# Patient Record
Sex: Female | Born: 1945
Health system: Southern US, Community
[De-identification: ages and names within clinical notes are randomized; demographics above are authoritative.]

---

## 2006-02-23 ENCOUNTER — Ambulatory Visit: Payer: Self-pay

## 2006-11-26 ENCOUNTER — Emergency Department: Payer: Self-pay | Admitting: Emergency Medicine

## 2008-04-01 ENCOUNTER — Emergency Department: Payer: Self-pay | Admitting: Emergency Medicine

## 2008-04-11 ENCOUNTER — Ambulatory Visit: Payer: Self-pay | Admitting: Family Medicine

## 2008-06-06 ENCOUNTER — Emergency Department: Payer: Self-pay | Admitting: Emergency Medicine

## 2008-08-16 ENCOUNTER — Emergency Department: Payer: Self-pay | Admitting: Emergency Medicine

## 2009-04-01 ENCOUNTER — Emergency Department: Payer: Self-pay | Admitting: Emergency Medicine

## 2009-07-28 ENCOUNTER — Emergency Department: Payer: Self-pay | Admitting: Emergency Medicine

## 2009-10-11 ENCOUNTER — Ambulatory Visit: Payer: Self-pay | Admitting: Neurosurgery

## 2009-12-18 ENCOUNTER — Ambulatory Visit: Payer: Self-pay | Admitting: Family Medicine

## 2009-12-30 ENCOUNTER — Ambulatory Visit: Payer: Self-pay | Admitting: Emergency Medicine

## 2010-01-02 LAB — PATHOLOGY REPORT

## 2010-01-13 ENCOUNTER — Emergency Department: Payer: Self-pay | Admitting: Unknown Physician Specialty

## 2010-03-18 ENCOUNTER — Ambulatory Visit: Payer: Self-pay | Admitting: Gastroenterology

## 2010-03-20 LAB — PATHOLOGY REPORT

## 2010-09-21 ENCOUNTER — Emergency Department: Payer: Self-pay | Admitting: Emergency Medicine

## 2010-10-09 ENCOUNTER — Ambulatory Visit: Payer: Self-pay | Admitting: Family Medicine

## 2010-11-04 ENCOUNTER — Ambulatory Visit: Payer: Self-pay | Admitting: Oncology

## 2010-11-05 ENCOUNTER — Ambulatory Visit: Payer: Self-pay | Admitting: Oncology

## 2010-12-05 ENCOUNTER — Ambulatory Visit: Payer: Self-pay | Admitting: Oncology

## 2011-01-04 ENCOUNTER — Ambulatory Visit: Payer: Self-pay | Admitting: Oncology

## 2011-01-10 ENCOUNTER — Emergency Department: Payer: Self-pay | Admitting: Emergency Medicine

## 2011-02-01 ENCOUNTER — Observation Stay: Payer: Self-pay | Admitting: Internal Medicine

## 2011-04-13 ENCOUNTER — Ambulatory Visit: Payer: Self-pay | Admitting: Oncology

## 2011-04-13 LAB — CBC CANCER CENTER
Basophil #: 0 x10 3/mm (ref 0.0–0.1)
Basophil %: 0.6 %
Eosinophil #: 0.1 x10 3/mm (ref 0.0–0.7)
Eosinophil %: 2.7 %
HCT: 36 % (ref 35.0–47.0)
Lymphocyte #: 0.9 x10 3/mm — ABNORMAL LOW (ref 1.0–3.6)
MCH: 30.2 pg (ref 26.0–34.0)
MCV: 89 fL (ref 80–100)
Monocyte #: 0.3 x10 3/mm (ref 0.0–0.7)
Monocyte %: 6.6 %
Neutrophil #: 3.3 x10 3/mm (ref 1.4–6.5)
Platelet: 107 x10 3/mm — ABNORMAL LOW (ref 150–440)
RBC: 4.03 10*6/uL (ref 3.80–5.20)
RDW: 15.4 % — ABNORMAL HIGH (ref 11.5–14.5)
WBC: 4.6 x10 3/mm (ref 3.6–11.0)

## 2011-04-13 LAB — IRON AND TIBC
Iron Bind.Cap.(Total): 348 ug/dL (ref 250–450)
Unbound Iron-Bind.Cap.: 302 ug/dL

## 2011-04-13 LAB — FERRITIN: Ferritin (ARMC): 36 ng/mL (ref 8–388)

## 2011-04-29 ENCOUNTER — Ambulatory Visit: Payer: Self-pay | Admitting: Urology

## 2011-04-29 LAB — CREATININE, SERUM
Creatinine: 1.12 mg/dL (ref 0.60–1.30)
EGFR (African American): >60
EGFR (Non-African Amer.): 52 — ABNORMAL LOW

## 2011-05-03 ENCOUNTER — Inpatient Hospital Stay: Payer: Self-pay | Admitting: Internal Medicine

## 2011-05-03 LAB — BASIC METABOLIC PANEL
BUN: 19 mg/dL — ABNORMAL HIGH (ref 7–18)
Calcium, Total: 8.6 mg/dL (ref 8.5–10.1)
Chloride: 101 mmol/L (ref 98–107)
Creatinine: 1.44 mg/dL — ABNORMAL HIGH (ref 0.60–1.30)
EGFR (African American): 47 — ABNORMAL LOW
Potassium: 5 mmol/L (ref 3.5–5.1)

## 2011-05-03 LAB — CBC
MCHC: 33.6 g/dL (ref 32.0–36.0)
RDW: 15.7 % — ABNORMAL HIGH (ref 11.5–14.5)
WBC: 5.9 10*3/uL (ref 3.6–11.0)

## 2011-05-04 LAB — MAGNESIUM: Magnesium: 1.6 mg/dL — ABNORMAL LOW

## 2011-05-04 LAB — CBC WITH DIFFERENTIAL/PLATELET
Basophil #: 0 10*3/uL (ref 0.0–0.1)
Eosinophil %: 0.2 %
HGB: 10.2 g/dL — ABNORMAL LOW (ref 12.0–16.0)
Lymphocyte #: 0.5 10*3/uL — ABNORMAL LOW (ref 1.0–3.6)
Lymphocyte %: 13.6 %
MCH: 29.7 pg (ref 26.0–34.0)
MCHC: 33.2 g/dL (ref 32.0–36.0)
MCV: 90 fL (ref 80–100)
Monocyte %: 1.5 %
Neutrophil %: 84.3 %
Platelet: 77 10*3/uL — ABNORMAL LOW (ref 150–440)
RBC: 3.42 10*6/uL — ABNORMAL LOW (ref 3.80–5.20)
WBC: 3.7 10*3/uL (ref 3.6–11.0)

## 2011-05-04 LAB — BASIC METABOLIC PANEL
Anion Gap: 12 (ref 7–16)
Calcium, Total: 8.5 mg/dL (ref 8.5–10.1)
Co2: 19 mmol/L — ABNORMAL LOW (ref 21–32)
EGFR (African American): 40 — ABNORMAL LOW
EGFR (Non-African Amer.): 33 — ABNORMAL LOW
Glucose: 337 mg/dL — ABNORMAL HIGH (ref 65–99)
Osmolality: 278 (ref 275–301)

## 2011-05-04 LAB — HEMOGLOBIN A1C: Hemoglobin A1C: 8.8 % — ABNORMAL HIGH (ref 4.2–6.3)

## 2011-05-07 ENCOUNTER — Ambulatory Visit: Payer: Self-pay | Admitting: Oncology

## 2011-05-27 ENCOUNTER — Ambulatory Visit: Payer: Self-pay | Admitting: Urology

## 2011-06-03 ENCOUNTER — Inpatient Hospital Stay: Payer: Self-pay | Admitting: Internal Medicine

## 2011-06-03 LAB — URINALYSIS, COMPLETE
Bilirubin,UR: NEGATIVE
Glucose,UR: NEGATIVE mg/dL (ref 0–75)
Hyaline Cast: 1
Ketone: NEGATIVE
Specific Gravity: 1.018 (ref 1.003–1.030)
Squamous Epithelial: 1
WBC UR: 89 /HPF (ref 0–5)

## 2011-06-03 LAB — CBC
HCT: 33 % — ABNORMAL LOW (ref 35.0–47.0)
HGB: 10.9 g/dL — ABNORMAL LOW (ref 12.0–16.0)
MCHC: 33 g/dL (ref 32.0–36.0)
MCV: 90 fL (ref 80–100)
Platelet: 139 10*3/uL — ABNORMAL LOW (ref 150–440)
RDW: 16.7 % — ABNORMAL HIGH (ref 11.5–14.5)
WBC: 3.3 10*3/uL — ABNORMAL LOW (ref 3.6–11.0)

## 2011-06-03 LAB — COMPREHENSIVE METABOLIC PANEL
BUN: 23 mg/dL — ABNORMAL HIGH (ref 7–18)
Bilirubin,Total: 0.5 mg/dL (ref 0.2–1.0)
Calcium, Total: 9.4 mg/dL (ref 8.5–10.1)
Chloride: 106 mmol/L (ref 98–107)
Co2: 18 mmol/L — ABNORMAL LOW (ref 21–32)
Creatinine: 1.79 mg/dL — ABNORMAL HIGH (ref 0.60–1.30)
EGFR (African American): 36 — ABNORMAL LOW
Potassium: 7.1 mmol/L (ref 3.5–5.1)
SGOT(AST): 54 U/L — ABNORMAL HIGH (ref 15–37)
SGPT (ALT): 22 U/L

## 2011-06-03 LAB — POTASSIUM
Potassium: 7.2 mmol/L (ref 3.5–5.1)
Potassium: 6.7 mmol/L (ref 3.5–5.1)
Potassium: 5.8 mmol/L — ABNORMAL HIGH (ref 3.5–5.1)

## 2011-06-03 LAB — TROPONIN I
Troponin-I: <0.02 ng/mL
Troponin-I: <0.02 ng/mL

## 2011-06-03 LAB — CK TOTAL AND CKMB (NOT AT ARMC)
CK, Total: 85 U/L (ref 21–215)
CK-MB: 2.1 ng/mL (ref 0.5–3.6)

## 2011-06-04 LAB — URINALYSIS, COMPLETE
Bacteria: NONE SEEN
Bilirubin,UR: NEGATIVE
Glucose,UR: NEGATIVE mg/dL (ref 0–75)
Ketone: NEGATIVE
Leukocyte Esterase: NEGATIVE
Ph: 7 (ref 4.5–8.0)
RBC,UR: 1 /HPF (ref 0–5)
Specific Gravity: 1.005 (ref 1.003–1.030)
Squamous Epithelial: <1
WBC UR: 2 /HPF (ref 0–5)

## 2011-06-04 LAB — PROTEIN / CREATININE RATIO, URINE: Protein, Random Urine: 28 mg/dL — ABNORMAL HIGH (ref 0–12)

## 2011-06-04 LAB — CBC WITH DIFFERENTIAL/PLATELET
Basophil #: 0 10*3/uL (ref 0.0–0.1)
Basophil %: 1.8 %
Eosinophil #: 0.1 10*3/uL (ref 0.0–0.7)
Lymphocyte %: 28.2 %
MCHC: 33.4 g/dL (ref 32.0–36.0)
MCV: 89 fL (ref 80–100)
Monocyte #: 0.3 10*3/uL (ref 0.0–0.7)
Neutrophil %: 55.9 %
Platelet: 122 10*3/uL — ABNORMAL LOW (ref 150–440)
RDW: 16.7 % — ABNORMAL HIGH (ref 11.5–14.5)
WBC: 2.6 10*3/uL — ABNORMAL LOW (ref 3.6–11.0)

## 2011-06-04 LAB — COMPREHENSIVE METABOLIC PANEL
Albumin: 2.7 g/dL — ABNORMAL LOW (ref 3.4–5.0)
Anion Gap: 7 (ref 7–16)
Bilirubin,Total: 0.7 mg/dL (ref 0.2–1.0)
Calcium, Total: 9.6 mg/dL (ref 8.5–10.1)
Chloride: 109 mmol/L — ABNORMAL HIGH (ref 98–107)
Creatinine: 1.71 mg/dL — ABNORMAL HIGH (ref 0.60–1.30)
EGFR (African American): 38 — ABNORMAL LOW
Glucose: 47 mg/dL — ABNORMAL LOW (ref 65–99)
Osmolality: 282 (ref 275–301)
Potassium: 5 mmol/L (ref 3.5–5.1)
SGOT(AST): 43 U/L — ABNORMAL HIGH (ref 15–37)
Sodium: 141 mmol/L (ref 136–145)
Total Protein: 7.5 g/dL (ref 6.4–8.2)

## 2011-06-04 LAB — CK TOTAL AND CKMB (NOT AT ARMC)
CK, Total: 62 U/L (ref 21–215)
CK-MB: 1.5 ng/mL (ref 0.5–3.6)

## 2011-06-04 LAB — TROPONIN I: Troponin-I: <0.02 ng/mL

## 2011-06-05 LAB — CBC WITH DIFFERENTIAL/PLATELET
Basophil #: 0.1 10*3/uL (ref 0.0–0.1)
Eosinophil %: 3.8 %
HCT: 32.5 % — ABNORMAL LOW (ref 35.0–47.0)
HGB: 10.8 g/dL — ABNORMAL LOW (ref 12.0–16.0)
Lymphocyte %: 16 %
MCH: 29.4 pg (ref 26.0–34.0)
Monocyte %: 9.1 %
Neutrophil %: 69.6 %
Platelet: 118 10*3/uL — ABNORMAL LOW (ref 150–440)
RBC: 3.67 10*6/uL — ABNORMAL LOW (ref 3.80–5.20)

## 2011-06-05 LAB — BASIC METABOLIC PANEL
Anion Gap: 14 (ref 7–16)
Calcium, Total: 9 mg/dL (ref 8.5–10.1)
Chloride: 103 mmol/L (ref 98–107)
EGFR (Non-African Amer.): 39 — ABNORMAL LOW
Glucose: 172 mg/dL — ABNORMAL HIGH (ref 65–99)
Osmolality: 283 (ref 275–301)
Potassium: 5.3 mmol/L — ABNORMAL HIGH (ref 3.5–5.1)

## 2011-06-06 LAB — CBC WITH DIFFERENTIAL/PLATELET
Basophil #: 0.1 10*3/uL (ref 0.0–0.1)
Eosinophil %: 3.2 %
HCT: 29.5 % — ABNORMAL LOW (ref 35.0–47.0)
Lymphocyte #: 0.8 10*3/uL — ABNORMAL LOW (ref 1.0–3.6)
Lymphocyte %: 23.8 %
MCHC: 32.8 g/dL (ref 32.0–36.0)
MCV: 90 fL (ref 80–100)
Monocyte #: 0.4 10*3/uL (ref 0.0–0.7)
Monocyte %: 12.8 %
Neutrophil #: 2 10*3/uL (ref 1.4–6.5)
Neutrophil %: 58.6 %
RBC: 3.29 10*6/uL — ABNORMAL LOW (ref 3.80–5.20)
RDW: 16.5 % — ABNORMAL HIGH (ref 11.5–14.5)
WBC: 3.5 10*3/uL — ABNORMAL LOW (ref 3.6–11.0)

## 2011-06-06 LAB — RENAL FUNCTION PANEL
Albumin: 2.5 g/dL — ABNORMAL LOW (ref 3.4–5.0)
Anion Gap: 14 (ref 7–16)
BUN: 13 mg/dL (ref 7–18)
EGFR (African American): >60
EGFR (Non-African Amer.): 52 — ABNORMAL LOW
Glucose: 154 mg/dL — ABNORMAL HIGH (ref 65–99)
Osmolality: 286 (ref 275–301)
Phosphorus: 3 mg/dL (ref 2.5–4.9)
Potassium: 3.8 mmol/L (ref 3.5–5.1)

## 2011-06-06 LAB — TROPONIN I
Troponin-I: <0.02 ng/mL
Troponin-I: <0.02 ng/mL

## 2011-06-06 LAB — CK TOTAL AND CKMB (NOT AT ARMC)
CK, Total: 31 U/L (ref 21–215)
CK-MB: 0.5 ng/mL (ref 0.5–3.6)

## 2011-06-06 LAB — MAGNESIUM: Magnesium: 1.5 mg/dL — ABNORMAL LOW

## 2011-06-07 LAB — CBC WITH DIFFERENTIAL/PLATELET
Basophil %: 1.1 %
Eosinophil #: 0.1 10*3/uL (ref 0.0–0.7)
HCT: 28.3 % — ABNORMAL LOW (ref 35.0–47.0)
MCH: 29.3 pg (ref 26.0–34.0)
MCHC: 32.5 g/dL (ref 32.0–36.0)
MCV: 90 fL (ref 80–100)
Monocyte #: 0.4 10*3/uL (ref 0.0–0.7)
Neutrophil #: 2.2 10*3/uL (ref 1.4–6.5)
Neutrophil %: 64 %
RBC: 3.13 10*6/uL — ABNORMAL LOW (ref 3.80–5.20)
RDW: 16.9 % — ABNORMAL HIGH (ref 11.5–14.5)
WBC: 3.4 10*3/uL — ABNORMAL LOW (ref 3.6–11.0)

## 2011-06-07 LAB — BASIC METABOLIC PANEL
BUN: 12 mg/dL (ref 7–18)
Chloride: 106 mmol/L (ref 98–107)
Co2: 22 mmol/L (ref 21–32)
Glucose: 153 mg/dL — ABNORMAL HIGH (ref 65–99)
Osmolality: 284 (ref 275–301)
Potassium: 4.3 mmol/L (ref 3.5–5.1)

## 2011-06-07 LAB — MAGNESIUM: Magnesium: 2.5 mg/dL — ABNORMAL HIGH

## 2011-06-08 LAB — PROTEIN ELECTROPHORESIS(ARMC)

## 2011-06-29 ENCOUNTER — Ambulatory Visit: Payer: Self-pay | Admitting: Urology

## 2011-07-04 LAB — COMPREHENSIVE METABOLIC PANEL
Alkaline Phosphatase: 119 U/L (ref 50–136)
BUN: 16 mg/dL (ref 7–18)
Bilirubin,Total: 1 mg/dL (ref 0.2–1.0)
Chloride: 103 mmol/L (ref 98–107)
Co2: 23 mmol/L (ref 21–32)
EGFR (Non-African Amer.): >60
Osmolality: 280 (ref 275–301)
Potassium: 3.2 mmol/L — ABNORMAL LOW (ref 3.5–5.1)
SGOT(AST): 96 U/L — ABNORMAL HIGH (ref 15–37)
SGPT (ALT): 34 U/L

## 2011-07-04 LAB — URINALYSIS, COMPLETE
Bacteria: NONE SEEN
Bilirubin,UR: NEGATIVE
Glucose,UR: 50 mg/dL (ref 0–75)
Hyaline Cast: 3
Ketone: NEGATIVE
Nitrite: NEGATIVE
Protein: 100
RBC,UR: 1 /HPF (ref 0–5)
WBC UR: 6 /HPF (ref 0–5)

## 2011-07-04 LAB — CBC
HGB: 10.8 g/dL — ABNORMAL LOW (ref 12.0–16.0)
MCH: 29.5 pg (ref 26.0–34.0)
MCV: 88 fL (ref 80–100)
Platelet: 82 10*3/uL — ABNORMAL LOW (ref 150–440)
RDW: 16.2 % — ABNORMAL HIGH (ref 11.5–14.5)
WBC: 3.5 10*3/uL — ABNORMAL LOW (ref 3.6–11.0)

## 2011-07-04 LAB — TROPONIN I: Troponin-I: <0.02 ng/mL

## 2011-07-05 ENCOUNTER — Inpatient Hospital Stay: Payer: Self-pay | Admitting: *Deleted

## 2011-07-05 LAB — APTT
Activated PTT: 35.4 secs (ref 23.6–35.9)
Activated PTT: 93.5 secs — ABNORMAL HIGH (ref 23.6–35.9)

## 2011-07-05 LAB — AMMONIA: Ammonia, Plasma: 57 mcmol/L — ABNORMAL HIGH (ref 11–32)

## 2011-07-05 LAB — TROPONIN I
Troponin-I: <0.02 ng/mL
Troponin-I: <0.02 ng/mL

## 2011-07-05 LAB — PROTIME-INR: INR: 1.4

## 2011-07-05 LAB — CK TOTAL AND CKMB (NOT AT ARMC)
CK, Total: 28 U/L (ref 21–215)
CK-MB: 0.5 ng/mL (ref 0.5–3.6)
CK-MB: 0.6 ng/mL (ref 0.5–3.6)

## 2011-07-06 LAB — BASIC METABOLIC PANEL
Chloride: 110 mmol/L — ABNORMAL HIGH (ref 98–107)
EGFR (African American): 52 — ABNORMAL LOW
Potassium: 3.7 mmol/L (ref 3.5–5.1)
Sodium: 141 mmol/L (ref 136–145)

## 2011-07-06 LAB — CBC WITH DIFFERENTIAL/PLATELET
Basophil #: 0 10*3/uL (ref 0.0–0.1)
Eosinophil #: 0.1 10*3/uL (ref 0.0–0.7)
Eosinophil %: 3.9 %
HCT: 30.2 % — ABNORMAL LOW (ref 35.0–47.0)
HGB: 9.9 g/dL — ABNORMAL LOW (ref 12.0–16.0)
MCHC: 32.8 g/dL (ref 32.0–36.0)
MCV: 89 fL (ref 80–100)
Monocyte #: 0.4 10*3/uL (ref 0.0–0.7)
Neutrophil %: 53.2 %
RBC: 3.37 10*6/uL — ABNORMAL LOW (ref 3.80–5.20)
RDW: 16.6 % — ABNORMAL HIGH (ref 11.5–14.5)
WBC: 3.5 10*3/uL — ABNORMAL LOW (ref 3.6–11.0)

## 2011-07-06 LAB — URINE CULTURE

## 2011-07-06 LAB — MAGNESIUM: Magnesium: 1.1 mg/dL — ABNORMAL LOW

## 2011-07-06 LAB — WBCS, STOOL

## 2011-07-06 LAB — APTT: Activated PTT: 127.4 secs — ABNORMAL HIGH (ref 23.6–35.9)

## 2011-07-07 LAB — BASIC METABOLIC PANEL
Anion Gap: 12 (ref 7–16)
BUN: 21 mg/dL — ABNORMAL HIGH (ref 7–18)
Chloride: 107 mmol/L (ref 98–107)
Creatinine: 1.24 mg/dL (ref 0.60–1.30)
EGFR (African American): 56 — ABNORMAL LOW
EGFR (Non-African Amer.): 46 — ABNORMAL LOW
Osmolality: 283 (ref 275–301)
Potassium: 4.3 mmol/L (ref 3.5–5.1)
Sodium: 138 mmol/L (ref 136–145)

## 2011-07-07 LAB — AMMONIA: Ammonia, Plasma: 65 mcmol/L — ABNORMAL HIGH (ref 11–32)

## 2011-07-07 LAB — STOOL CULTURE

## 2011-07-17 LAB — COMPREHENSIVE METABOLIC PANEL
Anion Gap: 16 (ref 7–16)
BUN: 27 mg/dL — ABNORMAL HIGH (ref 7–18)
Bilirubin,Total: 0.5 mg/dL (ref 0.2–1.0)
Calcium, Total: 8.5 mg/dL (ref 8.5–10.1)
Chloride: 99 mmol/L (ref 98–107)
EGFR (Non-African Amer.): 34 — ABNORMAL LOW
Osmolality: 285 (ref 275–301)
Potassium: 4 mmol/L (ref 3.5–5.1)
Sodium: 132 mmol/L — ABNORMAL LOW (ref 136–145)
Total Protein: 9.3 g/dL — ABNORMAL HIGH (ref 6.4–8.2)

## 2011-07-17 LAB — CBC
HCT: 34.3 % — ABNORMAL LOW (ref 35.0–47.0)
HGB: 11.3 g/dL — ABNORMAL LOW (ref 12.0–16.0)
MCH: 29.5 pg (ref 26.0–34.0)
MCHC: 33 g/dL (ref 32.0–36.0)
MCV: 89 fL (ref 80–100)
Platelet: 83 10*3/uL — ABNORMAL LOW (ref 150–440)
RBC: 3.84 10*6/uL (ref 3.80–5.20)
RDW: 15.4 % — ABNORMAL HIGH (ref 11.5–14.5)
WBC: 3.4 10*3/uL — ABNORMAL LOW (ref 3.6–11.0)

## 2011-07-17 LAB — TROPONIN I: Troponin-I: <0.02 ng/mL

## 2011-07-17 LAB — PRO B NATRIURETIC PEPTIDE: B-Type Natriuretic Peptide: 425 pg/mL — ABNORMAL HIGH (ref 0–125)

## 2011-07-18 LAB — BASIC METABOLIC PANEL
BUN: 26 mg/dL — ABNORMAL HIGH (ref 7–18)
Calcium, Total: 8.9 mg/dL (ref 8.5–10.1)
Chloride: 98 mmol/L (ref 98–107)
Co2: 23 mmol/L (ref 21–32)
Creatinine: 1.35 mg/dL — ABNORMAL HIGH (ref 0.60–1.30)
EGFR (African American): 47 — ABNORMAL LOW
EGFR (Non-African Amer.): 41 — ABNORMAL LOW
Glucose: 287 mg/dL — ABNORMAL HIGH (ref 65–99)
Osmolality: 285 (ref 275–301)
Sodium: 135 mmol/L — ABNORMAL LOW (ref 136–145)

## 2011-07-19 ENCOUNTER — Inpatient Hospital Stay: Payer: Self-pay | Admitting: Specialist

## 2011-07-19 LAB — BASIC METABOLIC PANEL
Chloride: 96 mmol/L — ABNORMAL LOW (ref 98–107)
Co2: 22 mmol/L (ref 21–32)
Creatinine: 1.25 mg/dL (ref 0.60–1.30)
EGFR (African American): 52 — ABNORMAL LOW
EGFR (Non-African Amer.): 44 — ABNORMAL LOW
Glucose: 395 mg/dL — ABNORMAL HIGH (ref 65–99)
Sodium: 131 mmol/L — ABNORMAL LOW (ref 136–145)

## 2011-07-20 LAB — BASIC METABOLIC PANEL
Anion Gap: 11 (ref 7–16)
BUN: 37 mg/dL — ABNORMAL HIGH (ref 7–18)
Creatinine: 1.18 mg/dL (ref 0.60–1.30)
Glucose: 390 mg/dL — ABNORMAL HIGH (ref 65–99)
Potassium: 4.2 mmol/L (ref 3.5–5.1)

## 2011-07-21 LAB — PLATELET COUNT: Platelet: 79 10*3/uL — ABNORMAL LOW (ref 150–440)

## 2011-07-23 LAB — CULTURE, BLOOD (SINGLE)

## 2011-09-02 ENCOUNTER — Ambulatory Visit: Payer: Self-pay | Admitting: Oncology

## 2011-09-02 LAB — CBC CANCER CENTER
Basophil #: 0.1 x10 3/mm (ref 0.0–0.1)
Basophil %: 2.8 %
Eosinophil #: 0.1 x10 3/mm (ref 0.0–0.7)
Lymphocyte %: 23.7 %
MCHC: 32.1 g/dL (ref 32.0–36.0)
MCV: 87 fL (ref 80–100)
Monocyte #: 0.5 x10 3/mm (ref 0.2–0.9)
Monocyte %: 9.6 %
Neutrophil #: 2.9 x10 3/mm (ref 1.4–6.5)
Platelet: 103 x10 3/mm — ABNORMAL LOW (ref 150–440)
RBC: 4.01 10*6/uL (ref 3.80–5.20)
RDW: 16.2 % — ABNORMAL HIGH (ref 11.5–14.5)
WBC: 4.7 x10 3/mm (ref 3.6–11.0)

## 2011-09-02 LAB — FERRITIN: Ferritin (ARMC): 22 ng/mL (ref 8–388)

## 2011-09-02 LAB — IRON AND TIBC
Iron Saturation: 23 %
Iron: 83 ug/dL (ref 50–170)

## 2011-09-04 ENCOUNTER — Ambulatory Visit: Payer: Self-pay | Admitting: Oncology

## 2011-09-18 ENCOUNTER — Inpatient Hospital Stay: Payer: Self-pay | Admitting: Internal Medicine

## 2011-09-18 LAB — DIGOXIN LEVEL: Digoxin: <0.06 ng/mL

## 2011-09-18 LAB — URINALYSIS, COMPLETE
Bacteria: NONE SEEN
Glucose,UR: NEGATIVE mg/dL (ref 0–75)
Ketone: NEGATIVE
Leukocyte Esterase: NEGATIVE
Ph: 6 (ref 4.5–8.0)
Specific Gravity: 1.009 (ref 1.003–1.030)
Squamous Epithelial: 1

## 2011-09-18 LAB — COMPREHENSIVE METABOLIC PANEL
Albumin: 2.6 g/dL — ABNORMAL LOW (ref 3.4–5.0)
BUN: 39 mg/dL — ABNORMAL HIGH (ref 7–18)
Bilirubin,Total: 0.7 mg/dL (ref 0.2–1.0)
Chloride: 107 mmol/L (ref 98–107)
Creatinine: 1.75 mg/dL — ABNORMAL HIGH (ref 0.60–1.30)
EGFR (Non-African Amer.): 30 — ABNORMAL LOW
Glucose: 176 mg/dL — ABNORMAL HIGH (ref 65–99)
Potassium: 6.4 mmol/L — ABNORMAL HIGH (ref 3.5–5.1)
SGOT(AST): 41 U/L — ABNORMAL HIGH (ref 15–37)

## 2011-09-18 LAB — CBC WITH DIFFERENTIAL/PLATELET
Basophil #: 0.1 10*3/uL (ref 0.0–0.1)
Basophil %: 1.3 %
Eosinophil #: 0.2 10*3/uL (ref 0.0–0.7)
Eosinophil %: 4.4 %
HCT: 31.6 % — ABNORMAL LOW (ref 35.0–47.0)
Lymphocyte #: 0.9 10*3/uL — ABNORMAL LOW (ref 1.0–3.6)
Lymphocyte %: 23.1 %
MCH: 28.2 pg (ref 26.0–34.0)
MCHC: 32.4 g/dL (ref 32.0–36.0)
MCV: 87 fL (ref 80–100)
Monocyte #: 0.5 x10 3/mm (ref 0.2–0.9)
Monocyte %: 12.1 %
Neutrophil %: 59.1 %
Platelet: 102 10*3/uL — ABNORMAL LOW (ref 150–440)
RBC: 3.63 10*6/uL — ABNORMAL LOW (ref 3.80–5.20)
WBC: 4.1 10*3/uL (ref 3.6–11.0)

## 2011-09-18 LAB — TSH: Thyroid Stimulating Horm: 1.09 u[IU]/mL

## 2011-09-18 LAB — AMMONIA: Ammonia, Plasma: 84 mcmol/L — ABNORMAL HIGH (ref 11–32)

## 2011-09-19 LAB — BASIC METABOLIC PANEL
Anion Gap: 11 (ref 7–16)
BUN: 26 mg/dL — ABNORMAL HIGH (ref 7–18)
Calcium, Total: 8.8 mg/dL (ref 8.5–10.1)
Chloride: 103 mmol/L (ref 98–107)
Co2: 22 mmol/L (ref 21–32)
Creatinine: 1.26 mg/dL (ref 0.60–1.30)
EGFR (African American): 51 — ABNORMAL LOW
EGFR (Non-African Amer.): 44 — ABNORMAL LOW
Glucose: 248 mg/dL — ABNORMAL HIGH (ref 65–99)
Osmolality: 285 (ref 275–301)
Potassium: 4.6 mmol/L (ref 3.5–5.1)

## 2011-09-19 LAB — PROTIME-INR: INR: 1.3

## 2011-09-19 LAB — MAGNESIUM: Magnesium: 1.3 mg/dL — ABNORMAL LOW

## 2011-10-05 ENCOUNTER — Emergency Department: Payer: Self-pay | Admitting: Emergency Medicine

## 2011-10-05 LAB — COMPREHENSIVE METABOLIC PANEL
Albumin: 3 g/dL — ABNORMAL LOW (ref 3.4–5.0)
Alkaline Phosphatase: 180 U/L — ABNORMAL HIGH (ref 50–136)
Bilirubin,Total: 0.7 mg/dL (ref 0.2–1.0)
Calcium, Total: 9.5 mg/dL (ref 8.5–10.1)
Chloride: 103 mmol/L (ref 98–107)
Co2: 23 mmol/L (ref 21–32)
Creatinine: 1.33 mg/dL — ABNORMAL HIGH (ref 0.60–1.30)
EGFR (African American): 48 — ABNORMAL LOW
EGFR (Non-African Amer.): 41 — ABNORMAL LOW
Glucose: 438 mg/dL — ABNORMAL HIGH (ref 65–99)
SGOT(AST): 35 U/L (ref 15–37)
SGPT (ALT): 18 U/L
Sodium: 134 mmol/L — ABNORMAL LOW (ref 136–145)
Total Protein: 9.4 g/dL — ABNORMAL HIGH (ref 6.4–8.2)

## 2011-10-05 LAB — CBC WITH DIFFERENTIAL/PLATELET
Basophil #: 0 10*3/uL (ref 0.0–0.1)
Eosinophil #: 0.1 10*3/uL (ref 0.0–0.7)
Lymphocyte %: 21.8 %
MCH: 28.5 pg (ref 26.0–34.0)
MCHC: 32.2 g/dL (ref 32.0–36.0)
Monocyte #: 0.3 x10 3/mm (ref 0.2–0.9)
Neutrophil #: 2.1 10*3/uL (ref 1.4–6.5)
Neutrophil %: 63.5 %
RDW: 17 % — ABNORMAL HIGH (ref 11.5–14.5)
WBC: 3.3 10*3/uL — ABNORMAL LOW (ref 3.6–11.0)

## 2011-10-05 LAB — URINALYSIS, COMPLETE
Bacteria: NONE SEEN
Glucose,UR: >=500 mg/dL (ref 0–75)
Ketone: NEGATIVE
Nitrite: NEGATIVE
RBC,UR: <1 /HPF (ref 0–5)
Specific Gravity: 1.018 (ref 1.003–1.030)
Squamous Epithelial: <1
WBC UR: 12 /HPF (ref 0–5)

## 2011-10-13 ENCOUNTER — Inpatient Hospital Stay: Payer: Self-pay | Admitting: Internal Medicine

## 2011-10-13 LAB — COMPREHENSIVE METABOLIC PANEL
Albumin: 2.7 g/dL — ABNORMAL LOW (ref 3.4–5.0)
Anion Gap: 9 (ref 7–16)
BUN: 23 mg/dL — ABNORMAL HIGH (ref 7–18)
Bilirubin,Total: 0.7 mg/dL (ref 0.2–1.0)
Chloride: 103 mmol/L (ref 98–107)
Creatinine: 1.43 mg/dL — ABNORMAL HIGH (ref 0.60–1.30)
EGFR (African American): 44 — ABNORMAL LOW
EGFR (Non-African Amer.): 38 — ABNORMAL LOW
Glucose: 367 mg/dL — ABNORMAL HIGH (ref 65–99)
Osmolality: 285 (ref 275–301)
Potassium: 5.7 mmol/L — ABNORMAL HIGH (ref 3.5–5.1)
SGOT(AST): 45 U/L — ABNORMAL HIGH (ref 15–37)
Sodium: 133 mmol/L — ABNORMAL LOW (ref 136–145)
Total Protein: 8.6 g/dL — ABNORMAL HIGH (ref 6.4–8.2)

## 2011-10-13 LAB — URINALYSIS, COMPLETE
Hyaline Cast: 13
Nitrite: NEGATIVE
Ph: 5 (ref 4.5–8.0)
Protein: 30
RBC,UR: NONE SEEN /HPF (ref 0–5)
Squamous Epithelial: 2
WBC UR: 4 /HPF (ref 0–5)

## 2011-10-13 LAB — CBC
MCHC: 32.2 g/dL (ref 32.0–36.0)
MCV: 88 fL (ref 80–100)
Platelet: 97 10*3/uL — ABNORMAL LOW (ref 150–440)
RBC: 3.51 10*6/uL — ABNORMAL LOW (ref 3.80–5.20)
RDW: 16.8 % — ABNORMAL HIGH (ref 11.5–14.5)
WBC: 4.6 10*3/uL (ref 3.6–11.0)

## 2011-10-13 LAB — PROTIME-INR
INR: 1.2
Prothrombin Time: 15.9 secs — ABNORMAL HIGH (ref 11.5–14.7)

## 2011-10-13 LAB — AMMONIA: Ammonia, Plasma: 184 mcmol/L — ABNORMAL HIGH (ref 11–32)

## 2011-10-13 LAB — APTT: Activated PTT: 36 secs — ABNORMAL HIGH (ref 23.6–35.9)

## 2011-10-13 LAB — CK TOTAL AND CKMB (NOT AT ARMC): CK, Total: 50 U/L (ref 21–215)

## 2011-10-13 LAB — TROPONIN I: Troponin-I: <0.02 ng/mL

## 2011-10-14 LAB — COMPREHENSIVE METABOLIC PANEL
Albumin: 2.7 g/dL — ABNORMAL LOW (ref 3.4–5.0)
Alkaline Phosphatase: 145 U/L — ABNORMAL HIGH (ref 50–136)
Anion Gap: 7 (ref 7–16)
BUN: 23 mg/dL — ABNORMAL HIGH (ref 7–18)
Bilirubin,Total: 0.6 mg/dL (ref 0.2–1.0)
Creatinine: 1.38 mg/dL — ABNORMAL HIGH (ref 0.60–1.30)
Glucose: 242 mg/dL — ABNORMAL HIGH (ref 65–99)
Osmolality: 285 (ref 275–301)
Potassium: 4.7 mmol/L (ref 3.5–5.1)
SGOT(AST): 37 U/L (ref 15–37)
Sodium: 137 mmol/L (ref 136–145)
Total Protein: 8.5 g/dL — ABNORMAL HIGH (ref 6.4–8.2)

## 2011-10-14 LAB — CBC WITH DIFFERENTIAL/PLATELET
Eosinophil #: 0.2 10*3/uL (ref 0.0–0.7)
Eosinophil %: 4.3 %
HGB: 10 g/dL — ABNORMAL LOW (ref 12.0–16.0)
MCH: 28.2 pg (ref 26.0–34.0)
MCHC: 31.7 g/dL — ABNORMAL LOW (ref 32.0–36.0)
Monocyte #: 0.3 x10 3/mm (ref 0.2–0.9)
Neutrophil #: 2.2 10*3/uL (ref 1.4–6.5)
Neutrophil %: 60.3 %
Platelet: 83 10*3/uL — ABNORMAL LOW (ref 150–440)
RBC: 3.56 10*6/uL — ABNORMAL LOW (ref 3.80–5.20)
RDW: 16.6 % — ABNORMAL HIGH (ref 11.5–14.5)

## 2011-10-15 ENCOUNTER — Emergency Department: Payer: Self-pay | Admitting: Emergency Medicine

## 2011-10-15 LAB — BASIC METABOLIC PANEL
BUN: 29 mg/dL — ABNORMAL HIGH (ref 7–18)
Calcium, Total: 9 mg/dL (ref 8.5–10.1)
Chloride: 104 mmol/L (ref 98–107)
Creatinine: 1.56 mg/dL — ABNORMAL HIGH (ref 0.60–1.30)
EGFR (Non-African Amer.): 34 — ABNORMAL LOW
Glucose: 266 mg/dL — ABNORMAL HIGH (ref 65–99)
Potassium: 4.9 mmol/L (ref 3.5–5.1)

## 2011-10-15 LAB — CBC
HCT: 29.1 % — ABNORMAL LOW (ref 35.0–47.0)
HGB: 9.4 g/dL — ABNORMAL LOW (ref 12.0–16.0)
MCHC: 32.2 g/dL (ref 32.0–36.0)
MCV: 88 fL (ref 80–100)
RDW: 16.5 % — ABNORMAL HIGH (ref 11.5–14.5)
WBC: 4 10*3/uL (ref 3.6–11.0)

## 2011-10-16 ENCOUNTER — Inpatient Hospital Stay: Payer: Self-pay | Admitting: Internal Medicine

## 2011-10-16 LAB — URINALYSIS, COMPLETE
Blood: NEGATIVE
Glucose,UR: NEGATIVE mg/dL (ref 0–75)
Ketone: NEGATIVE
Ph: 5 (ref 4.5–8.0)
RBC,UR: 6 /HPF (ref 0–5)
Specific Gravity: 1.017 (ref 1.003–1.030)
Squamous Epithelial: <1
WBC UR: 27 /HPF (ref 0–5)

## 2011-10-16 LAB — COMPREHENSIVE METABOLIC PANEL
Albumin: 2.7 g/dL — ABNORMAL LOW (ref 3.4–5.0)
Alkaline Phosphatase: 139 U/L — ABNORMAL HIGH (ref 50–136)
Anion Gap: 8 (ref 7–16)
BUN: 29 mg/dL — ABNORMAL HIGH (ref 7–18)
Calcium, Total: 9.1 mg/dL (ref 8.5–10.1)
Chloride: 106 mmol/L (ref 98–107)
Co2: 23 mmol/L (ref 21–32)
EGFR (African American): 32 — ABNORMAL LOW
EGFR (Non-African Amer.): 28 — ABNORMAL LOW
Osmolality: 284 (ref 275–301)
Potassium: 4.9 mmol/L (ref 3.5–5.1)
SGOT(AST): 54 U/L — ABNORMAL HIGH (ref 15–37)
Sodium: 137 mmol/L (ref 136–145)

## 2011-10-16 LAB — CBC
HCT: 30.2 % — ABNORMAL LOW (ref 35.0–47.0)
MCH: 27.9 pg (ref 26.0–34.0)
MCV: 88 fL (ref 80–100)
Platelet: 91 10*3/uL — ABNORMAL LOW (ref 150–440)
RBC: 3.42 10*6/uL — ABNORMAL LOW (ref 3.80–5.20)
RDW: 16.6 % — ABNORMAL HIGH (ref 11.5–14.5)

## 2011-10-16 LAB — CK TOTAL AND CKMB (NOT AT ARMC)
CK, Total: 24 U/L (ref 21–215)
CK-MB: <0.5 ng/mL — ABNORMAL LOW (ref 0.5–3.6)

## 2011-10-16 LAB — AMMONIA: Ammonia, Plasma: 82 mcmol/L — ABNORMAL HIGH (ref 11–32)

## 2011-10-16 LAB — CLOSTRIDIUM DIFFICILE BY PCR

## 2011-10-17 LAB — COMPREHENSIVE METABOLIC PANEL
Anion Gap: 10 (ref 7–16)
BUN: 22 mg/dL — ABNORMAL HIGH (ref 7–18)
Bilirubin,Total: 0.4 mg/dL (ref 0.2–1.0)
Chloride: 108 mmol/L — ABNORMAL HIGH (ref 98–107)
Co2: 20 mmol/L — ABNORMAL LOW (ref 21–32)
Creatinine: 1.22 mg/dL (ref 0.60–1.30)
EGFR (African American): 53 — ABNORMAL LOW
EGFR (Non-African Amer.): 46 — ABNORMAL LOW
Osmolality: 289 (ref 275–301)
Potassium: 4.6 mmol/L (ref 3.5–5.1)
SGPT (ALT): 28 U/L
Sodium: 138 mmol/L (ref 136–145)
Total Protein: 8.2 g/dL (ref 6.4–8.2)

## 2011-10-17 LAB — CBC WITH DIFFERENTIAL/PLATELET
HCT: 29.9 % — ABNORMAL LOW (ref 35.0–47.0)
Lymphocyte %: 21.8 %
MCHC: 30.7 g/dL — ABNORMAL LOW (ref 32.0–36.0)
Monocyte #: 0.3 x10 3/mm (ref 0.2–0.9)
Neutrophil #: 1.8 10*3/uL (ref 1.4–6.5)
Neutrophil %: 63.2 %
RDW: 16.7 % — ABNORMAL HIGH (ref 11.5–14.5)

## 2011-10-18 LAB — URINE CULTURE

## 2011-11-29 ENCOUNTER — Ambulatory Visit: Payer: Self-pay | Admitting: Ophthalmology

## 2011-11-29 LAB — POTASSIUM: Potassium: 4.6 mmol/L (ref 3.5–5.1)

## 2011-12-07 ENCOUNTER — Other Ambulatory Visit: Payer: Self-pay | Admitting: Anesthesiology

## 2011-12-14 ENCOUNTER — Ambulatory Visit: Payer: Self-pay | Admitting: Internal Medicine

## 2012-01-03 ENCOUNTER — Ambulatory Visit: Payer: Self-pay | Admitting: Ophthalmology

## 2012-01-03 LAB — POTASSIUM: Potassium: 4.3 mmol/L (ref 3.5–5.1)

## 2012-01-18 ENCOUNTER — Ambulatory Visit: Payer: Self-pay | Admitting: Ophthalmology

## 2012-01-30 ENCOUNTER — Inpatient Hospital Stay: Payer: Self-pay | Admitting: Internal Medicine

## 2012-01-30 LAB — PROTIME-INR
INR: 1.1
Prothrombin Time: 14.9 secs — ABNORMAL HIGH (ref 11.5–14.7)

## 2012-01-30 LAB — COMPREHENSIVE METABOLIC PANEL
Albumin: 3 g/dL — ABNORMAL LOW (ref 3.4–5.0)
Alkaline Phosphatase: 310 U/L — ABNORMAL HIGH (ref 50–136)
Anion Gap: 9 (ref 7–16)
BUN: 27 mg/dL — ABNORMAL HIGH (ref 7–18)
Bilirubin,Total: 0.7 mg/dL (ref 0.2–1.0)
Calcium, Total: 9.7 mg/dL (ref 8.5–10.1)
Co2: 21 mmol/L (ref 21–32)
EGFR (Non-African Amer.): 24 — ABNORMAL LOW
Glucose: 273 mg/dL — ABNORMAL HIGH (ref 65–99)
Osmolality: 279 (ref 275–301)
Potassium: 6.9 mmol/L (ref 3.5–5.1)
Sodium: 132 mmol/L — ABNORMAL LOW (ref 136–145)

## 2012-01-30 LAB — URINALYSIS, COMPLETE
Bilirubin,UR: NEGATIVE
Blood: NEGATIVE
Glucose,UR: >=500 mg/dL (ref 0–75)
Hyaline Cast: 28
Ketone: NEGATIVE
Nitrite: NEGATIVE
RBC,UR: 2 /HPF (ref 0–5)
Specific Gravity: 1.013 (ref 1.003–1.030)
Squamous Epithelial: 3
WBC UR: 14 /HPF (ref 0–5)

## 2012-01-30 LAB — CBC
MCHC: 32 g/dL (ref 32.0–36.0)
MCV: 86 fL (ref 80–100)
Platelet: 90 10*3/uL — ABNORMAL LOW (ref 150–440)
RDW: 16.8 % — ABNORMAL HIGH (ref 11.5–14.5)
WBC: 9.4 10*3/uL (ref 3.6–11.0)

## 2012-01-31 LAB — CBC WITH DIFFERENTIAL/PLATELET
Eosinophil #: 0.3 10*3/uL (ref 0.0–0.7)
HCT: 35.8 % (ref 35.0–47.0)
MCH: 28.4 pg (ref 26.0–34.0)
MCHC: 33.2 g/dL (ref 32.0–36.0)
MCV: 86 fL (ref 80–100)
Monocyte #: 0.7 x10 3/mm (ref 0.2–0.9)
Neutrophil #: 6.9 10*3/uL — ABNORMAL HIGH (ref 1.4–6.5)
Platelet: 121 10*3/uL — ABNORMAL LOW (ref 150–440)
RDW: 16.9 % — ABNORMAL HIGH (ref 11.5–14.5)

## 2012-01-31 LAB — COMPREHENSIVE METABOLIC PANEL
Anion Gap: 8 (ref 7–16)
BUN: 27 mg/dL — ABNORMAL HIGH (ref 7–18)
Calcium, Total: 9.4 mg/dL (ref 8.5–10.1)
Chloride: 106 mmol/L (ref 98–107)
Co2: 24 mmol/L (ref 21–32)
EGFR (African American): 26 — ABNORMAL LOW
EGFR (Non-African Amer.): 23 — ABNORMAL LOW
SGOT(AST): 71 U/L — ABNORMAL HIGH (ref 15–37)
SGPT (ALT): 39 U/L (ref 12–78)
Total Protein: 9.3 g/dL — ABNORMAL HIGH (ref 6.4–8.2)

## 2012-02-01 LAB — BASIC METABOLIC PANEL
Anion Gap: 9 (ref 7–16)
Calcium, Total: 8.2 mg/dL — ABNORMAL LOW (ref 8.5–10.1)
Co2: 21 mmol/L (ref 21–32)
Creatinine: 1.42 mg/dL — ABNORMAL HIGH (ref 0.60–1.30)
EGFR (African American): 44 — ABNORMAL LOW
EGFR (Non-African Amer.): 38 — ABNORMAL LOW
Glucose: 229 mg/dL — ABNORMAL HIGH (ref 65–99)
Osmolality: 287 (ref 275–301)
Sodium: 139 mmol/L (ref 136–145)

## 2012-02-02 LAB — URINE CULTURE

## 2012-03-14 ENCOUNTER — Ambulatory Visit: Payer: Self-pay | Admitting: Ophthalmology

## 2012-03-14 ENCOUNTER — Emergency Department: Payer: Self-pay | Admitting: Emergency Medicine

## 2012-03-14 LAB — POTASSIUM: Potassium: 4 mmol/L (ref 3.5–5.1)

## 2012-03-14 LAB — URINALYSIS, COMPLETE
Bacteria: NONE SEEN
Hyaline Cast: 4
Ketone: NEGATIVE
Ph: 5 (ref 4.5–8.0)
Protein: 30
RBC,UR: 1 /HPF (ref 0–5)
Squamous Epithelial: 1

## 2012-03-14 LAB — CBC
HCT: 34 % — ABNORMAL LOW (ref 35.0–47.0)
MCHC: 31.6 g/dL — ABNORMAL LOW (ref 32.0–36.0)
MCV: 85 fL (ref 80–100)
RBC: 3.98 10*6/uL (ref 3.80–5.20)
RDW: 15.4 % — ABNORMAL HIGH (ref 11.5–14.5)
WBC: 4.4 10*3/uL (ref 3.6–11.0)

## 2012-03-14 LAB — COMPREHENSIVE METABOLIC PANEL
Albumin: 2.8 g/dL — ABNORMAL LOW (ref 3.4–5.0)
Alkaline Phosphatase: 249 U/L — ABNORMAL HIGH (ref 50–136)
Calcium, Total: 8.8 mg/dL (ref 8.5–10.1)
Chloride: 103 mmol/L (ref 98–107)
Co2: 25 mmol/L (ref 21–32)
EGFR (African American): 58 — ABNORMAL LOW
EGFR (Non-African Amer.): 50 — ABNORMAL LOW
Glucose: 213 mg/dL — ABNORMAL HIGH (ref 65–99)
Osmolality: 277 (ref 275–301)
SGOT(AST): 38 U/L — ABNORMAL HIGH (ref 15–37)
SGPT (ALT): 20 U/L (ref 12–78)
Sodium: 135 mmol/L — ABNORMAL LOW (ref 136–145)

## 2012-03-14 LAB — AMMONIA: Ammonia, Plasma: 49 mcmol/L — ABNORMAL HIGH (ref 11–32)

## 2012-03-24 ENCOUNTER — Ambulatory Visit: Payer: Self-pay | Admitting: Ophthalmology

## 2012-05-06 ENCOUNTER — Ambulatory Visit: Payer: Self-pay | Admitting: Oncology

## 2012-08-06 ENCOUNTER — Inpatient Hospital Stay: Payer: Self-pay | Admitting: Internal Medicine

## 2012-08-06 LAB — CBC
MCH: 28.8 pg (ref 26.0–34.0)
MCHC: 33.1 g/dL (ref 32.0–36.0)
MCV: 87 fL (ref 80–100)
Platelet: 75 10*3/uL — ABNORMAL LOW (ref 150–440)
RBC: 3.53 10*6/uL — ABNORMAL LOW (ref 3.80–5.20)
RDW: 16.9 % — ABNORMAL HIGH (ref 11.5–14.5)
WBC: 4.8 10*3/uL (ref 3.6–11.0)

## 2012-08-06 LAB — AMMONIA: Ammonia, Plasma: 65 mcmol/L — ABNORMAL HIGH (ref 11–32)

## 2012-08-06 LAB — URINALYSIS, COMPLETE
Bilirubin,UR: NEGATIVE
Blood: NEGATIVE
Glucose,UR: NEGATIVE mg/dL (ref 0–75)
Hyaline Cast: 14
Ketone: NEGATIVE
Ph: 5 (ref 4.5–8.0)
Protein: 30
RBC,UR: 2 /HPF (ref 0–5)
Specific Gravity: 1.016 (ref 1.003–1.030)

## 2012-08-06 LAB — COMPREHENSIVE METABOLIC PANEL
Albumin: 2.5 g/dL — ABNORMAL LOW (ref 3.4–5.0)
Alkaline Phosphatase: 218 U/L — ABNORMAL HIGH (ref 50–136)
Anion Gap: 7 (ref 7–16)
Calcium, Total: 8.5 mg/dL (ref 8.5–10.1)
Chloride: 110 mmol/L — ABNORMAL HIGH (ref 98–107)
Creatinine: 1.78 mg/dL — ABNORMAL HIGH (ref 0.60–1.30)
EGFR (Non-African Amer.): 29 — ABNORMAL LOW
Glucose: 165 mg/dL — ABNORMAL HIGH (ref 65–99)
Osmolality: 289 (ref 275–301)
Total Protein: 8.4 g/dL — ABNORMAL HIGH (ref 6.4–8.2)

## 2012-08-06 LAB — CK TOTAL AND CKMB (NOT AT ARMC)
CK, Total: 66 U/L (ref 21–215)
CK-MB: 1.2 ng/mL (ref 0.5–3.6)

## 2012-08-06 LAB — PROTIME-INR: INR: 1.3

## 2012-08-06 LAB — TROPONIN I: Troponin-I: <0.02 ng/mL

## 2012-08-07 DIAGNOSIS — R9431 Abnormal electrocardiogram [ECG] [EKG]: Secondary | ICD-10-CM

## 2012-08-07 LAB — CBC WITH DIFFERENTIAL/PLATELET
Basophil #: 0.1 10*3/uL (ref 0.0–0.1)
Eosinophil %: 3.4 %
HCT: 30.8 % — ABNORMAL LOW (ref 35.0–47.0)
Lymphocyte #: 0.9 10*3/uL — ABNORMAL LOW (ref 1.0–3.6)
MCH: 28.9 pg (ref 26.0–34.0)
MCV: 88 fL (ref 80–100)
Monocyte #: 0.4 x10 3/mm (ref 0.2–0.9)

## 2012-08-07 LAB — COMPREHENSIVE METABOLIC PANEL
Anion Gap: 8 (ref 7–16)
BUN: 27 mg/dL — ABNORMAL HIGH (ref 7–18)
Calcium, Total: 8.6 mg/dL (ref 8.5–10.1)
Chloride: 111 mmol/L — ABNORMAL HIGH (ref 98–107)
EGFR (African American): 39 — ABNORMAL LOW
Glucose: 171 mg/dL — ABNORMAL HIGH (ref 65–99)
Osmolality: 289 (ref 275–301)
Potassium: 4.4 mmol/L (ref 3.5–5.1)
SGOT(AST): 55 U/L — ABNORMAL HIGH (ref 15–37)
SGPT (ALT): 24 U/L (ref 12–78)
Sodium: 140 mmol/L (ref 136–145)
Total Protein: 7.8 g/dL (ref 6.4–8.2)

## 2012-08-07 LAB — TROPONIN I: Troponin-I: <0.02 ng/mL

## 2012-08-08 LAB — COMPREHENSIVE METABOLIC PANEL
Albumin: 2.2 g/dL — ABNORMAL LOW (ref 3.4–5.0)
Alkaline Phosphatase: 224 U/L — ABNORMAL HIGH (ref 50–136)
Bilirubin,Total: 0.4 mg/dL (ref 0.2–1.0)
Chloride: 112 mmol/L — ABNORMAL HIGH (ref 98–107)
Creatinine: 1.19 mg/dL (ref 0.60–1.30)
EGFR (African American): 54 — ABNORMAL LOW
EGFR (Non-African Amer.): 47 — ABNORMAL LOW
Glucose: 161 mg/dL — ABNORMAL HIGH (ref 65–99)
Potassium: 4.6 mmol/L (ref 3.5–5.1)
SGOT(AST): 66 U/L — ABNORMAL HIGH (ref 15–37)

## 2012-08-08 LAB — CBC WITH DIFFERENTIAL/PLATELET
Basophil #: 0 10*3/uL (ref 0.0–0.1)
Basophil %: 1.3 %
HCT: 27.2 % — ABNORMAL LOW (ref 35.0–47.0)
HGB: 9 g/dL — ABNORMAL LOW (ref 12.0–16.0)
Lymphocyte #: 0.7 10*3/uL — ABNORMAL LOW (ref 1.0–3.6)
MCH: 28.7 pg (ref 26.0–34.0)
MCHC: 33.2 g/dL (ref 32.0–36.0)
Monocyte #: 0.3 x10 3/mm (ref 0.2–0.9)
Monocyte %: 8.2 %
Neutrophil #: 2.3 10*3/uL (ref 1.4–6.5)
RBC: 3.15 10*6/uL — ABNORMAL LOW (ref 3.80–5.20)
RDW: 16.7 % — ABNORMAL HIGH (ref 11.5–14.5)
WBC: 3.4 10*3/uL — ABNORMAL LOW (ref 3.6–11.0)

## 2012-08-08 LAB — AMMONIA: Ammonia, Plasma: 75 mcmol/L — ABNORMAL HIGH (ref 11–32)

## 2012-08-08 LAB — URINE CULTURE

## 2012-08-14 ENCOUNTER — Inpatient Hospital Stay: Payer: Self-pay | Admitting: Specialist

## 2012-08-14 LAB — URINALYSIS, COMPLETE
Blood: NEGATIVE
Hyaline Cast: 21
Ph: 5 (ref 4.5–8.0)
Protein: NEGATIVE
RBC,UR: 4 /HPF (ref 0–5)
Specific Gravity: 1.018 (ref 1.003–1.030)

## 2012-08-14 LAB — COMPREHENSIVE METABOLIC PANEL
Albumin: 2.4 g/dL — ABNORMAL LOW (ref 3.4–5.0)
Anion Gap: 7 (ref 7–16)
BUN: 33 mg/dL — ABNORMAL HIGH (ref 7–18)
Bilirubin,Total: 0.5 mg/dL (ref 0.2–1.0)
Calcium, Total: 8 mg/dL — ABNORMAL LOW (ref 8.5–10.1)
Chloride: 111 mmol/L — ABNORMAL HIGH (ref 98–107)
Creatinine: 2.59 mg/dL — ABNORMAL HIGH (ref 0.60–1.30)
EGFR (African American): 21 — ABNORMAL LOW
Glucose: 110 mg/dL — ABNORMAL HIGH (ref 65–99)
Osmolality: 282 (ref 275–301)
Potassium: 5.2 mmol/L — ABNORMAL HIGH (ref 3.5–5.1)
SGPT (ALT): 23 U/L (ref 12–78)
Sodium: 137 mmol/L (ref 136–145)
Total Protein: 7.9 g/dL (ref 6.4–8.2)

## 2012-08-14 LAB — CBC
HCT: 28.2 % — ABNORMAL LOW (ref 35.0–47.0)
HGB: 9.2 g/dL — ABNORMAL LOW (ref 12.0–16.0)
MCHC: 32.6 g/dL (ref 32.0–36.0)
MCV: 88 fL (ref 80–100)
Platelet: 84 10*3/uL — ABNORMAL LOW (ref 150–440)
RBC: 3.19 10*6/uL — ABNORMAL LOW (ref 3.80–5.20)
WBC: 5.9 10*3/uL (ref 3.6–11.0)

## 2012-08-15 LAB — BASIC METABOLIC PANEL
Anion Gap: 5 — ABNORMAL LOW (ref 7–16)
Chloride: 112 mmol/L — ABNORMAL HIGH (ref 98–107)
Creatinine: 2.63 mg/dL — ABNORMAL HIGH (ref 0.60–1.30)
EGFR (African American): 21 — ABNORMAL LOW
EGFR (Non-African Amer.): 18 — ABNORMAL LOW
Glucose: 47 mg/dL — ABNORMAL LOW (ref 65–99)
Osmolality: 281 (ref 275–301)

## 2012-08-16 LAB — CBC WITH DIFFERENTIAL/PLATELET
Basophil %: 2 %
HGB: 9.5 g/dL — ABNORMAL LOW (ref 12.0–16.0)
Lymphocyte #: 0.8 10*3/uL — ABNORMAL LOW (ref 1.0–3.6)
MCH: 29.1 pg (ref 26.0–34.0)
MCV: 89 fL (ref 80–100)
Monocyte #: 0.5 x10 3/mm (ref 0.2–0.9)
Monocyte %: 11.9 %
Neutrophil %: 62.7 %
Platelet: 73 10*3/uL — ABNORMAL LOW (ref 150–440)
RBC: 3.26 10*6/uL — ABNORMAL LOW (ref 3.80–5.20)
RDW: 16.8 % — ABNORMAL HIGH (ref 11.5–14.5)

## 2012-08-16 LAB — BASIC METABOLIC PANEL
Anion Gap: 8 (ref 7–16)
Calcium, Total: 8 mg/dL — ABNORMAL LOW (ref 8.5–10.1)
Chloride: 110 mmol/L — ABNORMAL HIGH (ref 98–107)
Co2: 19 mmol/L — ABNORMAL LOW (ref 21–32)
EGFR (African American): 31 — ABNORMAL LOW
EGFR (Non-African Amer.): 26 — ABNORMAL LOW
Glucose: 95 mg/dL (ref 65–99)
Osmolality: 281 (ref 275–301)
Potassium: 4.9 mmol/L (ref 3.5–5.1)

## 2012-08-17 LAB — POTASSIUM: Potassium: 4.9 mmol/L (ref 3.5–5.1)

## 2012-08-17 LAB — BASIC METABOLIC PANEL
Anion Gap: 6 — ABNORMAL LOW (ref 7–16)
BUN: 30 mg/dL — ABNORMAL HIGH (ref 7–18)
Creatinine: 1.74 mg/dL — ABNORMAL HIGH (ref 0.60–1.30)
Glucose: 100 mg/dL — ABNORMAL HIGH (ref 65–99)
Potassium: 6 mmol/L — ABNORMAL HIGH (ref 3.5–5.1)
Sodium: 140 mmol/L (ref 136–145)

## 2012-08-20 ENCOUNTER — Inpatient Hospital Stay: Payer: Self-pay | Admitting: Specialist

## 2012-08-20 LAB — URINALYSIS, COMPLETE
Bilirubin,UR: NEGATIVE
Glucose,UR: NEGATIVE mg/dL (ref 0–75)
Ketone: NEGATIVE
Leukocyte Esterase: NEGATIVE
Nitrite: NEGATIVE
Protein: NEGATIVE
RBC,UR: 1 /HPF (ref 0–5)
Specific Gravity: 1.006 (ref 1.003–1.030)
WBC UR: 2 /HPF (ref 0–5)

## 2012-08-20 LAB — CBC
HGB: 9.8 g/dL — ABNORMAL LOW (ref 12.0–16.0)
MCH: 28.5 pg (ref 26.0–34.0)
Platelet: 99 10*3/uL — ABNORMAL LOW (ref 150–440)
RBC: 3.43 10*6/uL — ABNORMAL LOW (ref 3.80–5.20)
WBC: 5.4 10*3/uL (ref 3.6–11.0)

## 2012-08-20 LAB — CK TOTAL AND CKMB (NOT AT ARMC)
CK-MB: 2.8 ng/mL (ref 0.5–3.6)
CK-MB: 2.1 ng/mL (ref 0.5–3.6)

## 2012-08-20 LAB — COMPREHENSIVE METABOLIC PANEL
Albumin: 2.6 g/dL — ABNORMAL LOW (ref 3.4–5.0)
Alkaline Phosphatase: 251 U/L — ABNORMAL HIGH (ref 50–136)
BUN: 29 mg/dL — ABNORMAL HIGH (ref 7–18)
Bilirubin,Total: 0.4 mg/dL (ref 0.2–1.0)
Chloride: 108 mmol/L — ABNORMAL HIGH (ref 98–107)
Co2: 19 mmol/L — ABNORMAL LOW (ref 21–32)
EGFR (African American): 36 — ABNORMAL LOW
Glucose: 32 mg/dL — CL (ref 65–99)
Potassium: 5 mmol/L (ref 3.5–5.1)

## 2012-08-20 LAB — PRO B NATRIURETIC PEPTIDE: B-Type Natriuretic Peptide: 1048 pg/mL — ABNORMAL HIGH (ref 0–125)

## 2012-08-21 LAB — BASIC METABOLIC PANEL
Anion Gap: 8 (ref 7–16)
Anion Gap: 9 (ref 7–16)
BUN: 32 mg/dL — ABNORMAL HIGH (ref 7–18)
BUN: 35 mg/dL — ABNORMAL HIGH (ref 7–18)
Calcium, Total: 8 mg/dL — ABNORMAL LOW (ref 8.5–10.1)
Calcium, Total: 8.7 mg/dL (ref 8.5–10.1)
Chloride: 107 mmol/L (ref 98–107)
Co2: 19 mmol/L — ABNORMAL LOW (ref 21–32)
Co2: 20 mmol/L — ABNORMAL LOW (ref 21–32)
EGFR (African American): 32 — ABNORMAL LOW
EGFR (African American): 33 — ABNORMAL LOW
Glucose: 106 mg/dL — ABNORMAL HIGH (ref 65–99)
Osmolality: 280 (ref 275–301)
Potassium: 5.5 mmol/L — ABNORMAL HIGH (ref 3.5–5.1)

## 2012-08-21 LAB — CBC WITH DIFFERENTIAL/PLATELET
Basophil #: 0 10*3/uL (ref 0.0–0.1)
Basophil %: 0.3 %
Eosinophil %: 0.3 %
Eosinophil %: 0.5 %
HCT: 27 % — ABNORMAL LOW (ref 35.0–47.0)
HGB: 8.8 g/dL — ABNORMAL LOW (ref 12.0–16.0)
HGB: 8.9 g/dL — ABNORMAL LOW (ref 12.0–16.0)
Lymphocyte %: 12 %
MCH: 28.7 pg (ref 26.0–34.0)
MCH: 28.7 pg (ref 26.0–34.0)
MCHC: 31.9 g/dL — ABNORMAL LOW (ref 32.0–36.0)
MCHC: 32.9 g/dL (ref 32.0–36.0)
MCV: 87 fL (ref 80–100)
Monocyte %: 7.3 %
Platelet: 69 10*3/uL — ABNORMAL LOW (ref 150–440)
Platelet: 81 10*3/uL — ABNORMAL LOW (ref 150–440)
RBC: 3.07 10*6/uL — ABNORMAL LOW (ref 3.80–5.20)
RBC: 3.1 10*6/uL — ABNORMAL LOW (ref 3.80–5.20)
RDW: 16.5 % — ABNORMAL HIGH (ref 11.5–14.5)
WBC: 1.8 10*3/uL — CL (ref 3.6–11.0)

## 2012-08-21 LAB — IRON AND TIBC
Iron Bind.Cap.(Total): 343 ug/dL (ref 250–450)
Unbound Iron-Bind.Cap.: 317 ug/dL

## 2012-08-21 LAB — FOLATE: Folic Acid: 17.4 ng/mL (ref 3.1–100.0)

## 2012-08-21 LAB — HEPATIC FUNCTION PANEL A (ARMC)
Albumin: 2.3 g/dL — ABNORMAL LOW (ref 3.4–5.0)
Alkaline Phosphatase: 212 U/L — ABNORMAL HIGH (ref 50–136)
Bilirubin, Direct: 0.2 mg/dL (ref 0.00–0.20)
Total Protein: 7.7 g/dL (ref 6.4–8.2)

## 2012-08-21 LAB — CK TOTAL AND CKMB (NOT AT ARMC): CK, Total: 95 U/L (ref 21–215)

## 2012-08-21 LAB — FERRITIN: Ferritin (ARMC): 33 ng/mL (ref 8–388)

## 2012-08-21 LAB — RETICULOCYTES: Absolute Retic Count: 0.0503 10*6/uL

## 2012-08-22 LAB — BASIC METABOLIC PANEL
BUN: 36 mg/dL — ABNORMAL HIGH (ref 7–18)
Chloride: 110 mmol/L — ABNORMAL HIGH (ref 98–107)
Co2: 22 mmol/L (ref 21–32)
Creatinine: 1.53 mg/dL — ABNORMAL HIGH (ref 0.60–1.30)
EGFR (Non-African Amer.): 35 — ABNORMAL LOW
Glucose: 102 mg/dL — ABNORMAL HIGH (ref 65–99)

## 2012-08-22 LAB — OCCULT BLOOD X 1 CARD TO LAB, STOOL: Occult Blood, Feces: NEGATIVE

## 2012-08-23 LAB — CBC WITH DIFFERENTIAL/PLATELET
Basophil #: 0 10*3/uL (ref 0.0–0.1)
Basophil %: 1.5 %
HCT: 26 % — ABNORMAL LOW (ref 35.0–47.0)
Lymphocyte %: 26.4 %
MCH: 29 pg (ref 26.0–34.0)
MCHC: 33.3 g/dL (ref 32.0–36.0)
MCV: 87 fL (ref 80–100)
Monocyte #: 0.3 x10 3/mm (ref 0.2–0.9)
Monocyte %: 10.8 %
Neutrophil %: 57.5 %
Platelet: 75 10*3/uL — ABNORMAL LOW (ref 150–440)
RBC: 2.98 10*6/uL — ABNORMAL LOW (ref 3.80–5.20)
RDW: 16.7 % — ABNORMAL HIGH (ref 11.5–14.5)
WBC: 2.9 10*3/uL — ABNORMAL LOW (ref 3.6–11.0)

## 2012-08-23 LAB — COMPREHENSIVE METABOLIC PANEL
Albumin: 2.2 g/dL — ABNORMAL LOW (ref 3.4–5.0)
Alkaline Phosphatase: 249 U/L — ABNORMAL HIGH (ref 50–136)
Anion Gap: 4 — ABNORMAL LOW (ref 7–16)
Calcium, Total: 8.9 mg/dL (ref 8.5–10.1)
Chloride: 109 mmol/L — ABNORMAL HIGH (ref 98–107)
Creatinine: 1.48 mg/dL — ABNORMAL HIGH (ref 0.60–1.30)
EGFR (African American): 42 — ABNORMAL LOW
EGFR (Non-African Amer.): 36 — ABNORMAL LOW
Glucose: 74 mg/dL (ref 65–99)
Potassium: 4.5 mmol/L (ref 3.5–5.1)
SGOT(AST): 84 U/L — ABNORMAL HIGH (ref 15–37)
Sodium: 140 mmol/L (ref 136–145)
Total Protein: 7.3 g/dL (ref 6.4–8.2)

## 2012-08-24 LAB — CBC WITH DIFFERENTIAL/PLATELET
Basophil #: 0 10*3/uL (ref 0.0–0.1)
Eosinophil #: 0.1 10*3/uL (ref 0.0–0.7)
Eosinophil %: 5.2 %
HCT: 29.1 % — ABNORMAL LOW (ref 35.0–47.0)
HGB: 9.8 g/dL — ABNORMAL LOW (ref 12.0–16.0)
Lymphocyte %: 19 %
MCH: 29.4 pg (ref 26.0–34.0)
MCHC: 33.8 g/dL (ref 32.0–36.0)
MCV: 87 fL (ref 80–100)
Monocyte #: 0.3 x10 3/mm (ref 0.2–0.9)
Monocyte %: 9.8 %
Neutrophil #: 1.9 10*3/uL (ref 1.4–6.5)
Platelet: 73 10*3/uL — ABNORMAL LOW (ref 150–440)
RBC: 3.35 10*6/uL — ABNORMAL LOW (ref 3.80–5.20)
RDW: 16.8 % — ABNORMAL HIGH (ref 11.5–14.5)
WBC: 2.9 10*3/uL — ABNORMAL LOW (ref 3.6–11.0)

## 2012-08-24 LAB — BASIC METABOLIC PANEL
BUN: 28 mg/dL — ABNORMAL HIGH (ref 7–18)
Chloride: 107 mmol/L (ref 98–107)
Co2: 27 mmol/L (ref 21–32)
Creatinine: 1.52 mg/dL — ABNORMAL HIGH (ref 0.60–1.30)
EGFR (African American): 40 — ABNORMAL LOW
Glucose: 209 mg/dL — ABNORMAL HIGH (ref 65–99)
Sodium: 141 mmol/L (ref 136–145)

## 2012-08-25 LAB — RENAL FUNCTION PANEL
Anion Gap: 6 — ABNORMAL LOW (ref 7–16)
Calcium, Total: 8.4 mg/dL — ABNORMAL LOW (ref 8.5–10.1)
Creatinine: 1.5 mg/dL — ABNORMAL HIGH (ref 0.60–1.30)
EGFR (Non-African Amer.): 35 — ABNORMAL LOW
Glucose: 121 mg/dL — ABNORMAL HIGH (ref 65–99)
Osmolality: 281 (ref 275–301)
Phosphorus: 3.4 mg/dL (ref 2.5–4.9)
Potassium: 4.1 mmol/L (ref 3.5–5.1)
Sodium: 138 mmol/L (ref 136–145)

## 2012-08-29 ENCOUNTER — Inpatient Hospital Stay: Payer: Self-pay | Admitting: Specialist

## 2012-08-29 LAB — URINALYSIS, COMPLETE
Bilirubin,UR: NEGATIVE
Blood: NEGATIVE
Glucose,UR: NEGATIVE mg/dL (ref 0–75)
Hyaline Cast: 1
Ketone: NEGATIVE
Protein: NEGATIVE
RBC,UR: NONE SEEN /HPF (ref 0–5)
Squamous Epithelial: 1
WBC UR: 2 /HPF (ref 0–5)

## 2012-08-29 LAB — COMPREHENSIVE METABOLIC PANEL
Alkaline Phosphatase: 246 U/L — ABNORMAL HIGH (ref 50–136)
Anion Gap: 9 (ref 7–16)
BUN: 38 mg/dL — ABNORMAL HIGH (ref 7–18)
Chloride: 100 mmol/L (ref 98–107)
Creatinine: 2.81 mg/dL — ABNORMAL HIGH (ref 0.60–1.30)
Glucose: 72 mg/dL (ref 65–99)
Osmolality: 274 (ref 275–301)
SGOT(AST): 64 U/L — ABNORMAL HIGH (ref 15–37)
Sodium: 133 mmol/L — ABNORMAL LOW (ref 136–145)
Total Protein: 7.6 g/dL (ref 6.4–8.2)

## 2012-08-29 LAB — CBC
HGB: 9.6 g/dL — ABNORMAL LOW (ref 12.0–16.0)
MCH: 28.9 pg (ref 26.0–34.0)
MCHC: 33.3 g/dL (ref 32.0–36.0)
MCV: 87 fL (ref 80–100)
Platelet: 59 10*3/uL — ABNORMAL LOW (ref 150–440)
RBC: 3.32 10*6/uL — ABNORMAL LOW (ref 3.80–5.20)

## 2012-08-29 LAB — CK TOTAL AND CKMB (NOT AT ARMC)
CK, Total: 91 U/L (ref 21–215)
CK-MB: 1.5 ng/mL (ref 0.5–3.6)

## 2012-08-29 LAB — LIPASE, BLOOD: Lipase: 83 U/L (ref 73–393)

## 2012-08-29 LAB — AMMONIA: Ammonia, Plasma: 46 mcmol/L — ABNORMAL HIGH (ref 11–32)

## 2012-08-29 LAB — TROPONIN I: Troponin-I: <0.02 ng/mL

## 2012-08-29 LAB — PROTIME-INR
INR: 1.3
Prothrombin Time: 16.2 secs — ABNORMAL HIGH (ref 11.5–14.7)

## 2012-08-30 LAB — BASIC METABOLIC PANEL
Anion Gap: 7 (ref 7–16)
BUN: 32 mg/dL — ABNORMAL HIGH (ref 7–18)
Creatinine: 2.43 mg/dL — ABNORMAL HIGH (ref 0.60–1.30)
EGFR (African American): 23 — ABNORMAL LOW
Osmolality: 275 (ref 275–301)
Sodium: 134 mmol/L — ABNORMAL LOW (ref 136–145)

## 2012-08-30 LAB — CBC WITH DIFFERENTIAL/PLATELET
Eosinophil #: 0.1 10*3/uL (ref 0.0–0.7)
Eosinophil %: 2.1 %
HCT: 29.3 % — ABNORMAL LOW (ref 35.0–47.0)
HGB: 9.9 g/dL — ABNORMAL LOW (ref 12.0–16.0)
Lymphocyte %: 22.7 %
MCH: 29.6 pg (ref 26.0–34.0)
MCHC: 34 g/dL (ref 32.0–36.0)
MCV: 87 fL (ref 80–100)
Monocyte #: 0.6 x10 3/mm (ref 0.2–0.9)
Monocyte %: 15.4 %
Neutrophil #: 2.4 10*3/uL (ref 1.4–6.5)
Neutrophil %: 58.5 %
RBC: 3.35 10*6/uL — ABNORMAL LOW (ref 3.80–5.20)

## 2012-08-30 LAB — CREATININE, URINE, RANDOM: Creatinine, Urine Random: 30.8 mg/dL (ref 30.0–125.0)

## 2012-08-30 LAB — SODIUM, URINE, RANDOM: Sodium, Urine Random: 24 mmol/L (ref 20–110)

## 2012-08-31 LAB — BASIC METABOLIC PANEL
BUN: 28 mg/dL — ABNORMAL HIGH (ref 7–18)
Calcium, Total: 8.4 mg/dL — ABNORMAL LOW (ref 8.5–10.1)
Co2: 25 mmol/L (ref 21–32)
Creatinine: 2.1 mg/dL — ABNORMAL HIGH (ref 0.60–1.30)
EGFR (African American): 27 — ABNORMAL LOW
EGFR (Non-African Amer.): 24 — ABNORMAL LOW
Osmolality: 276 (ref 275–301)
Potassium: 4.1 mmol/L (ref 3.5–5.1)
Sodium: 136 mmol/L (ref 136–145)

## 2012-09-01 LAB — BASIC METABOLIC PANEL
BUN: 24 mg/dL — ABNORMAL HIGH (ref 7–18)
Calcium, Total: 8.8 mg/dL (ref 8.5–10.1)
Chloride: 105 mmol/L (ref 98–107)
EGFR (Non-African Amer.): 28 — ABNORMAL LOW
Glucose: 97 mg/dL (ref 65–99)
Osmolality: 278 (ref 275–301)
Sodium: 137 mmol/L (ref 136–145)

## 2012-09-10 ENCOUNTER — Inpatient Hospital Stay: Payer: Self-pay | Admitting: Internal Medicine

## 2012-09-10 LAB — URINALYSIS, COMPLETE
Bacteria: NONE SEEN
Bilirubin,UR: NEGATIVE
Bilirubin,UR: NEGATIVE
Blood: NEGATIVE
Glucose,UR: NEGATIVE mg/dL (ref 0–75)
Glucose,UR: NEGATIVE mg/dL (ref 0–75)
Ketone: NEGATIVE
Leukocyte Esterase: NEGATIVE
Nitrite: NEGATIVE
Ph: 6 (ref 4.5–8.0)
Ph: 6 (ref 4.5–8.0)
Protein: NEGATIVE
Protein: NEGATIVE
RBC,UR: <1 /HPF (ref 0–5)
Specific Gravity: 1.006 (ref 1.003–1.030)
Squamous Epithelial: NONE SEEN
WBC UR: 1 /HPF (ref 0–5)
WBC UR: 3 /HPF (ref 0–5)

## 2012-09-10 LAB — COMPREHENSIVE METABOLIC PANEL
Albumin: 3.5 g/dL (ref 3.4–5.0)
Alkaline Phosphatase: 221 U/L — ABNORMAL HIGH (ref 50–136)
Bilirubin,Total: 0.9 mg/dL (ref 0.2–1.0)
Chloride: 111 mmol/L — ABNORMAL HIGH (ref 98–107)
Co2: 19 mmol/L — ABNORMAL LOW (ref 21–32)
Creatinine: 1.88 mg/dL — ABNORMAL HIGH (ref 0.60–1.30)
EGFR (African American): 31 — ABNORMAL LOW
Osmolality: 285 (ref 275–301)
SGOT(AST): 69 U/L — ABNORMAL HIGH (ref 15–37)
SGPT (ALT): 29 U/L (ref 12–78)
Total Protein: 8.3 g/dL — ABNORMAL HIGH (ref 6.4–8.2)

## 2012-09-10 LAB — AMMONIA: Ammonia, Plasma: 138 mcmol/L — ABNORMAL HIGH (ref 11–32)

## 2012-09-10 LAB — CBC WITH DIFFERENTIAL/PLATELET
Basophil #: 0 10*3/uL (ref 0.0–0.1)
Basophil %: 1.6 %
Eosinophil #: 0.1 10*3/uL (ref 0.0–0.7)
HGB: 9 g/dL — ABNORMAL LOW (ref 12.0–16.0)
Lymphocyte #: 0.6 10*3/uL — ABNORMAL LOW (ref 1.0–3.6)
MCHC: 32.5 g/dL (ref 32.0–36.0)
Monocyte %: 11.4 %
Neutrophil #: 1.9 10*3/uL (ref 1.4–6.5)
Neutrophil %: 63.5 %
WBC: 2.9 10*3/uL — ABNORMAL LOW (ref 3.6–11.0)

## 2012-09-10 LAB — CK TOTAL AND CKMB (NOT AT ARMC): CK-MB: 1.6 ng/mL (ref 0.5–3.6)

## 2012-09-11 LAB — CBC WITH DIFFERENTIAL/PLATELET
Basophil #: 0.1 10*3/uL (ref 0.0–0.1)
Eosinophil #: 0.1 10*3/uL (ref 0.0–0.7)
HGB: 8.9 g/dL — ABNORMAL LOW (ref 12.0–16.0)
Lymphocyte %: 22.3 %
MCH: 28.8 pg (ref 26.0–34.0)
MCHC: 32.9 g/dL (ref 32.0–36.0)
Monocyte #: 0.3 x10 3/mm (ref 0.2–0.9)
Neutrophil #: 1.6 10*3/uL (ref 1.4–6.5)
Neutrophil %: 61.4 %
Platelet: 77 10*3/uL — ABNORMAL LOW (ref 150–440)
RDW: 15.7 % — ABNORMAL HIGH (ref 11.5–14.5)
WBC: 2.7 10*3/uL — ABNORMAL LOW (ref 3.6–11.0)

## 2012-09-11 LAB — BASIC METABOLIC PANEL
Anion Gap: 8 (ref 7–16)
BUN: 26 mg/dL — ABNORMAL HIGH (ref 7–18)
Calcium, Total: 9 mg/dL (ref 8.5–10.1)
Chloride: 122 mmol/L — ABNORMAL HIGH (ref 98–107)
EGFR (African American): 39 — ABNORMAL LOW
EGFR (Non-African Amer.): 34 — ABNORMAL LOW
Glucose: 74 mg/dL (ref 65–99)
Potassium: 4.8 mmol/L (ref 3.5–5.1)
Sodium: 149 mmol/L — ABNORMAL HIGH (ref 136–145)

## 2012-09-11 LAB — HEMOGLOBIN A1C: Hemoglobin A1C: 6 % (ref 4.2–6.3)

## 2012-09-12 LAB — BASIC METABOLIC PANEL
Anion Gap: 7 (ref 7–16)
Calcium, Total: 8.4 mg/dL — ABNORMAL LOW (ref 8.5–10.1)
Chloride: 116 mmol/L — ABNORMAL HIGH (ref 98–107)
EGFR (African American): 40 — ABNORMAL LOW
Glucose: 147 mg/dL — ABNORMAL HIGH (ref 65–99)
Osmolality: 290 (ref 275–301)
Potassium: 4.8 mmol/L (ref 3.5–5.1)
Sodium: 142 mmol/L (ref 136–145)

## 2012-10-08 LAB — BASIC METABOLIC PANEL
Anion Gap: 10 (ref 7–16)
BUN: 47 mg/dL — ABNORMAL HIGH (ref 7–18)
Calcium, Total: 8.2 mg/dL — ABNORMAL LOW (ref 8.5–10.1)
Co2: 25 mmol/L (ref 21–32)
Creatinine: 1.99 mg/dL — ABNORMAL HIGH (ref 0.60–1.30)
EGFR (African American): 29 — ABNORMAL LOW
EGFR (Non-African Amer.): 25 — ABNORMAL LOW
Glucose: 190 mg/dL — ABNORMAL HIGH (ref 65–99)
Potassium: 4 mmol/L (ref 3.5–5.1)
Sodium: 138 mmol/L (ref 136–145)

## 2012-10-08 LAB — CBC
HGB: 8.1 g/dL — ABNORMAL LOW (ref 12.0–16.0)
MCH: 26.4 pg (ref 26.0–34.0)
RBC: 3.07 10*6/uL — ABNORMAL LOW (ref 3.80–5.20)
RDW: 15.9 % — ABNORMAL HIGH (ref 11.5–14.5)

## 2012-10-09 ENCOUNTER — Inpatient Hospital Stay: Payer: Self-pay | Admitting: Internal Medicine

## 2012-10-09 LAB — HEPATIC FUNCTION PANEL A (ARMC)
Bilirubin, Direct: 0.4 mg/dL — ABNORMAL HIGH (ref 0.00–0.20)
SGPT (ALT): 32 U/L (ref 12–78)
Total Protein: 8.5 g/dL — ABNORMAL HIGH (ref 6.4–8.2)

## 2012-10-09 LAB — AMMONIA: Ammonia, Plasma: 207 mcmol/L — ABNORMAL HIGH (ref 11–32)

## 2012-10-10 LAB — CBC WITH DIFFERENTIAL/PLATELET
Basophil #: 0.1 10*3/uL (ref 0.0–0.1)
Eosinophil #: 0.1 10*3/uL (ref 0.0–0.7)
Eosinophil %: 3.1 %
HGB: 8.5 g/dL — ABNORMAL LOW (ref 12.0–16.0)
Lymphocyte #: 0.6 10*3/uL — ABNORMAL LOW (ref 1.0–3.6)
Lymphocyte %: 15.6 %
MCH: 26.5 pg (ref 26.0–34.0)
MCHC: 31.7 g/dL — ABNORMAL LOW (ref 32.0–36.0)
MCV: 84 fL (ref 80–100)
Monocyte %: 10.6 %
Neutrophil #: 2.6 10*3/uL (ref 1.4–6.5)
Platelet: 74 10*3/uL — ABNORMAL LOW (ref 150–440)
RDW: 15.6 % — ABNORMAL HIGH (ref 11.5–14.5)
WBC: 3.8 10*3/uL (ref 3.6–11.0)

## 2012-10-10 LAB — BASIC METABOLIC PANEL
Anion Gap: 10 (ref 7–16)
BUN: 32 mg/dL — ABNORMAL HIGH (ref 7–18)
Chloride: 111 mmol/L — ABNORMAL HIGH (ref 98–107)
EGFR (African American): 37 — ABNORMAL LOW
EGFR (Non-African Amer.): 32 — ABNORMAL LOW
Glucose: 118 mg/dL — ABNORMAL HIGH (ref 65–99)
Osmolality: 293 (ref 275–301)

## 2012-10-11 LAB — BASIC METABOLIC PANEL
Anion Gap: 9 (ref 7–16)
BUN: 28 mg/dL — ABNORMAL HIGH (ref 7–18)
Co2: 24 mmol/L (ref 21–32)
Creatinine: 1.48 mg/dL — ABNORMAL HIGH (ref 0.60–1.30)
Glucose: 116 mg/dL — ABNORMAL HIGH (ref 65–99)
Osmolality: 293 (ref 275–301)

## 2012-10-30 ENCOUNTER — Inpatient Hospital Stay: Payer: Self-pay | Admitting: Internal Medicine

## 2012-10-30 LAB — COMPREHENSIVE METABOLIC PANEL
Albumin: 2.8 g/dL — ABNORMAL LOW (ref 3.4–5.0)
Alkaline Phosphatase: 289 U/L — ABNORMAL HIGH (ref 50–136)
Anion Gap: 8 (ref 7–16)
Calcium, Total: 9 mg/dL (ref 8.5–10.1)
Chloride: 101 mmol/L (ref 98–107)
EGFR (African American): 39 — ABNORMAL LOW
EGFR (Non-African Amer.): 33 — ABNORMAL LOW
Sodium: 135 mmol/L — ABNORMAL LOW (ref 136–145)
Total Protein: 8.2 g/dL (ref 6.4–8.2)

## 2012-10-30 LAB — URINALYSIS, COMPLETE
Glucose,UR: NEGATIVE mg/dL (ref 0–75)
Ketone: NEGATIVE
Ph: 5 (ref 4.5–8.0)
Protein: NEGATIVE
Specific Gravity: 1.005 (ref 1.003–1.030)

## 2012-10-30 LAB — LACTATE DEHYDROGENASE, PLEURAL OR PERITONEAL FLUID: LDH, Body Fluid: 32 U/L

## 2012-10-30 LAB — HEPATIC FUNCTION PANEL A (ARMC)
Albumin: 2.7 g/dL — ABNORMAL LOW (ref 3.4–5.0)
Alkaline Phosphatase: 257 U/L — ABNORMAL HIGH (ref 50–136)
SGOT(AST): 48 U/L — ABNORMAL HIGH (ref 15–37)
Total Protein: 8.2 g/dL (ref 6.4–8.2)

## 2012-10-30 LAB — PROTEIN, BODY FLUID: Protein, Body Fluid: 1.2 g/dL

## 2012-10-30 LAB — TROPONIN I: Troponin-I: <0.02 ng/mL

## 2012-10-30 LAB — ALBUMIN, FLUID (OTHER): Body Fluid Albumin: <0.6 g/dL

## 2012-10-30 LAB — PRO B NATRIURETIC PEPTIDE: B-Type Natriuretic Peptide: 867 pg/mL — ABNORMAL HIGH (ref 0–125)

## 2012-10-30 LAB — CK TOTAL AND CKMB (NOT AT ARMC): CK-MB: 2 ng/mL (ref 0.5–3.6)

## 2012-10-30 LAB — CBC
HGB: 9.5 g/dL — ABNORMAL LOW (ref 12.0–16.0)
MCH: 26.1 pg (ref 26.0–34.0)
Platelet: 102 10*3/uL — ABNORMAL LOW (ref 150–440)
RBC: 3.62 10*6/uL — ABNORMAL LOW (ref 3.80–5.20)
RDW: 17.2 % — ABNORMAL HIGH (ref 11.5–14.5)
WBC: 4.2 10*3/uL (ref 3.6–11.0)

## 2012-10-30 LAB — PROTIME-INR: INR: 1.2

## 2012-10-30 LAB — APTT: Activated PTT: 37.1 secs — ABNORMAL HIGH (ref 23.6–35.9)

## 2012-10-31 LAB — BASIC METABOLIC PANEL
Anion Gap: 6 — ABNORMAL LOW (ref 7–16)
BUN: 34 mg/dL — ABNORMAL HIGH (ref 7–18)
Calcium, Total: 8.3 mg/dL — ABNORMAL LOW (ref 8.5–10.1)
Co2: 29 mmol/L (ref 21–32)
Creatinine: 1.79 mg/dL — ABNORMAL HIGH (ref 0.60–1.30)
EGFR (African American): 33 — ABNORMAL LOW

## 2012-10-31 LAB — CBC WITH DIFFERENTIAL/PLATELET
Basophil #: 0.1 10*3/uL (ref 0.0–0.1)
Basophil %: 2.2 %
Eosinophil #: 0.1 10*3/uL (ref 0.0–0.7)
Eosinophil %: 4.6 %
HCT: 25.1 % — ABNORMAL LOW (ref 35.0–47.0)
HGB: 8.2 g/dL — ABNORMAL LOW (ref 12.0–16.0)
Lymphocyte #: 0.5 10*3/uL — ABNORMAL LOW (ref 1.0–3.6)
Lymphocyte %: 18.6 %
MCH: 26.9 pg (ref 26.0–34.0)
MCHC: 32.7 g/dL (ref 32.0–36.0)
Monocyte #: 0.3 x10 3/mm (ref 0.2–0.9)
Neutrophil #: 1.7 10*3/uL (ref 1.4–6.5)
WBC: 2.8 10*3/uL — ABNORMAL LOW (ref 3.6–11.0)

## 2012-11-03 LAB — BASIC METABOLIC PANEL
Anion Gap: 5 — ABNORMAL LOW (ref 7–16)
BUN: 31 mg/dL — ABNORMAL HIGH (ref 7–18)
Calcium, Total: 8.4 mg/dL — ABNORMAL LOW (ref 8.5–10.1)
Chloride: 101 mmol/L (ref 98–107)
Creatinine: 1.57 mg/dL — ABNORMAL HIGH (ref 0.60–1.30)
EGFR (African American): 39 — ABNORMAL LOW
Glucose: 216 mg/dL — ABNORMAL HIGH (ref 65–99)
Osmolality: 285 (ref 275–301)
Potassium: 3.4 mmol/L — ABNORMAL LOW (ref 3.5–5.1)
Sodium: 136 mmol/L (ref 136–145)

## 2012-11-03 LAB — CBC WITH DIFFERENTIAL/PLATELET
Basophil #: 0.1 10*3/uL (ref 0.0–0.1)
Eosinophil #: 0.1 10*3/uL (ref 0.0–0.7)
Lymphocyte #: 0.6 10*3/uL — ABNORMAL LOW (ref 1.0–3.6)
MCH: 26.6 pg (ref 26.0–34.0)
MCHC: 32.8 g/dL (ref 32.0–36.0)
MCV: 81 fL (ref 80–100)
Monocyte #: 0.3 x10 3/mm (ref 0.2–0.9)
Monocyte %: 12.6 %
Neutrophil #: 1.6 10*3/uL (ref 1.4–6.5)
Neutrophil %: 60.5 %
Platelet: 55 10*3/uL — ABNORMAL LOW (ref 150–440)
RBC: 3.12 10*6/uL — ABNORMAL LOW (ref 3.80–5.20)
RDW: 16.9 % — ABNORMAL HIGH (ref 11.5–14.5)
WBC: 2.7 10*3/uL — ABNORMAL LOW (ref 3.6–11.0)

## 2012-11-03 LAB — BODY FLUID CULTURE

## 2012-11-04 LAB — AMMONIA: Ammonia, Plasma: <25 mcmol/L (ref 11–32)

## 2012-11-25 ENCOUNTER — Other Ambulatory Visit: Payer: Self-pay | Admitting: Family Medicine

## 2012-11-25 LAB — BASIC METABOLIC PANEL
Anion Gap: 3 — ABNORMAL LOW (ref 7–16)
BUN: 14 mg/dL (ref 7–18)
Calcium, Total: 8.6 mg/dL (ref 8.5–10.1)
Chloride: 105 mmol/L (ref 98–107)
Co2: 30 mmol/L (ref 21–32)
Creatinine: 1.24 mg/dL (ref 0.60–1.30)
EGFR (African American): 52 — ABNORMAL LOW
Potassium: 5.3 mmol/L — ABNORMAL HIGH (ref 3.5–5.1)
Sodium: 138 mmol/L (ref 136–145)

## 2012-11-25 LAB — AMMONIA: Ammonia, Plasma: 63 mcmol/L — ABNORMAL HIGH (ref 11–32)

## 2012-12-12 ENCOUNTER — Other Ambulatory Visit: Payer: Self-pay

## 2012-12-12 LAB — BASIC METABOLIC PANEL
Anion Gap: 5 — ABNORMAL LOW (ref 7–16)
Calcium, Total: 9 mg/dL (ref 8.5–10.1)
Chloride: 104 mmol/L (ref 98–107)
Creatinine: 1.36 mg/dL — ABNORMAL HIGH (ref 0.60–1.30)
Glucose: 193 mg/dL — ABNORMAL HIGH (ref 65–99)
Osmolality: 281 (ref 275–301)

## 2013-02-06 ENCOUNTER — Inpatient Hospital Stay: Payer: Self-pay | Admitting: Internal Medicine

## 2013-02-06 LAB — COMPREHENSIVE METABOLIC PANEL
Albumin: 2.2 g/dL — ABNORMAL LOW (ref 3.4–5.0)
Albumin: 1.9 g/dL — ABNORMAL LOW (ref 3.4–5.0)
Alkaline Phosphatase: 208 U/L — ABNORMAL HIGH (ref 50–136)
Anion Gap: 2 — ABNORMAL LOW (ref 7–16)
Anion Gap: 3 — ABNORMAL LOW (ref 7–16)
BUN: 31 mg/dL — ABNORMAL HIGH (ref 7–18)
Bilirubin,Total: 1.1 mg/dL — ABNORMAL HIGH (ref 0.2–1.0)
Calcium, Total: 7.8 mg/dL — ABNORMAL LOW (ref 8.5–10.1)
Chloride: 110 mmol/L — ABNORMAL HIGH (ref 98–107)
Chloride: 112 mmol/L — ABNORMAL HIGH (ref 98–107)
Co2: 25 mmol/L (ref 21–32)
Creatinine: 1.47 mg/dL — ABNORMAL HIGH (ref 0.60–1.30)
EGFR (African American): 37 — ABNORMAL LOW
EGFR (Non-African Amer.): 36 — ABNORMAL LOW
EGFR (Non-African Amer.): 32 — ABNORMAL LOW
Glucose: 133 mg/dL — ABNORMAL HIGH (ref 65–99)
Osmolality: 278 (ref 275–301)
SGPT (ALT): 20 U/L (ref 12–78)
Sodium: 135 mmol/L — ABNORMAL LOW (ref 136–145)
Total Protein: 9.1 g/dL — ABNORMAL HIGH (ref 6.4–8.2)
Total Protein: 7.6 g/dL (ref 6.4–8.2)

## 2013-02-06 LAB — CBC
HGB: 11.1 g/dL — ABNORMAL LOW (ref 12.0–16.0)
MCV: 85 fL (ref 80–100)
Platelet: 102 10*3/uL — ABNORMAL LOW (ref 150–440)
RBC: 3.93 10*6/uL (ref 3.80–5.20)
WBC: 5.5 10*3/uL (ref 3.6–11.0)

## 2013-02-06 LAB — URINALYSIS, COMPLETE
Bilirubin,UR: NEGATIVE
Glucose,UR: NEGATIVE mg/dL (ref 0–75)
Hyaline Cast: 3
Ketone: NEGATIVE
Leukocyte Esterase: NEGATIVE
Nitrite: NEGATIVE
Specific Gravity: 1.018 (ref 1.003–1.030)
WBC UR: 2 /HPF (ref 0–5)

## 2013-02-06 LAB — TROPONIN I: Troponin-I: <0.02 ng/mL

## 2013-02-06 LAB — AMMONIA: Ammonia, Plasma: 97 mcmol/L — ABNORMAL HIGH (ref 11–32)

## 2013-02-06 LAB — CK TOTAL AND CKMB (NOT AT ARMC): CK, Total: 140 U/L (ref 21–215)

## 2013-02-06 LAB — PROTIME-INR: INR: 1.4

## 2013-02-07 LAB — CBC WITH DIFFERENTIAL/PLATELET
Basophil #: 0.1 10*3/uL (ref 0.0–0.1)
Eosinophil #: 0.3 10*3/uL (ref 0.0–0.7)
Eosinophil %: 10 %
HGB: 8.7 g/dL — ABNORMAL LOW (ref 12.0–16.0)
MCH: 28.2 pg (ref 26.0–34.0)
Monocyte #: 0.3 x10 3/mm (ref 0.2–0.9)
Monocyte %: 9.7 %
Neutrophil #: 1.9 10*3/uL (ref 1.4–6.5)
Platelet: 69 10*3/uL — ABNORMAL LOW (ref 150–440)
RBC: 3.08 10*6/uL — ABNORMAL LOW (ref 3.80–5.20)
RDW: 19.2 % — ABNORMAL HIGH (ref 11.5–14.5)
WBC: 3 10*3/uL — ABNORMAL LOW (ref 3.6–11.0)

## 2013-02-07 LAB — BASIC METABOLIC PANEL
Anion Gap: 4 — ABNORMAL LOW (ref 7–16)
Calcium, Total: 8.1 mg/dL — ABNORMAL LOW (ref 8.5–10.1)
Chloride: 112 mmol/L — ABNORMAL HIGH (ref 98–107)
Co2: 25 mmol/L (ref 21–32)
Creatinine: 1.91 mg/dL — ABNORMAL HIGH (ref 0.60–1.30)
EGFR (African American): 31 — ABNORMAL LOW
EGFR (Non-African Amer.): 26 — ABNORMAL LOW
Osmolality: 295 (ref 275–301)
Potassium: 4.8 mmol/L (ref 3.5–5.1)

## 2013-02-08 LAB — BASIC METABOLIC PANEL
Anion Gap: 8 (ref 7–16)
Calcium, Total: 7.9 mg/dL — ABNORMAL LOW (ref 8.5–10.1)
Co2: 23 mmol/L (ref 21–32)
Creatinine: 1.78 mg/dL — ABNORMAL HIGH (ref 0.60–1.30)
EGFR (African American): 33 — ABNORMAL LOW
Osmolality: 285 (ref 275–301)
Potassium: 4.7 mmol/L (ref 3.5–5.1)
Sodium: 137 mmol/L (ref 136–145)

## 2013-02-20 ENCOUNTER — Inpatient Hospital Stay: Payer: Self-pay | Admitting: Internal Medicine

## 2013-02-20 LAB — CBC
HCT: 28.7 % — ABNORMAL LOW (ref 35.0–47.0)
HGB: 9.6 g/dL — ABNORMAL LOW (ref 12.0–16.0)
MCH: 28.9 pg (ref 26.0–34.0)
MCHC: 33.3 g/dL (ref 32.0–36.0)
MCV: 87 fL (ref 80–100)
RBC: 3.31 10*6/uL — ABNORMAL LOW (ref 3.80–5.20)
RDW: 18.5 % — ABNORMAL HIGH (ref 11.5–14.5)
WBC: 4.6 10*3/uL (ref 3.6–11.0)

## 2013-02-20 LAB — BASIC METABOLIC PANEL
Calcium, Total: 8.7 mg/dL (ref 8.5–10.1)
Chloride: 108 mmol/L — ABNORMAL HIGH (ref 98–107)
Co2: 25 mmol/L (ref 21–32)
Creatinine: 2.01 mg/dL — ABNORMAL HIGH (ref 0.60–1.30)
EGFR (African American): 29 — ABNORMAL LOW
Osmolality: 288 (ref 275–301)
Potassium: 4.5 mmol/L (ref 3.5–5.1)

## 2013-02-20 LAB — PROTIME-INR
INR: 1.6
Prothrombin Time: 19.1 secs — ABNORMAL HIGH (ref 11.5–14.7)

## 2013-02-28 ENCOUNTER — Other Ambulatory Visit: Payer: Self-pay | Admitting: Family Medicine

## 2013-02-28 LAB — PROTIME-INR
INR: 1.4
Prothrombin Time: 16.8 secs — ABNORMAL HIGH (ref 11.5–14.7)

## 2013-03-01 ENCOUNTER — Other Ambulatory Visit: Payer: Self-pay | Admitting: Family Medicine

## 2013-03-01 LAB — CBC WITH DIFFERENTIAL/PLATELET
Basophil #: 0.1 10*3/uL (ref 0.0–0.1)
Eosinophil #: 0.5 10*3/uL (ref 0.0–0.7)
Eosinophil %: 11.2 %
HCT: 28.9 % — ABNORMAL LOW (ref 35.0–47.0)
HGB: 9.3 g/dL — ABNORMAL LOW (ref 12.0–16.0)
Lymphocyte %: 11.8 %
Monocyte #: 0.4 x10 3/mm (ref 0.2–0.9)
Neutrophil #: 3 10*3/uL (ref 1.4–6.5)
Neutrophil %: 64.5 %
RDW: 18.8 % — ABNORMAL HIGH (ref 11.5–14.5)

## 2013-03-01 LAB — COMPREHENSIVE METABOLIC PANEL
Albumin: 1.8 g/dL — ABNORMAL LOW (ref 3.4–5.0)
Anion Gap: 7 (ref 7–16)
Bilirubin,Total: 0.8 mg/dL (ref 0.2–1.0)
Calcium, Total: 8.2 mg/dL — ABNORMAL LOW (ref 8.5–10.1)
Chloride: 106 mmol/L (ref 98–107)
Co2: 26 mmol/L (ref 21–32)
Creatinine: 1.7 mg/dL — ABNORMAL HIGH (ref 0.60–1.30)
EGFR (African American): 35 — ABNORMAL LOW
Glucose: 172 mg/dL — ABNORMAL HIGH (ref 65–99)
Osmolality: 291 (ref 275–301)
Potassium: 3.7 mmol/L (ref 3.5–5.1)
SGOT(AST): 44 U/L — ABNORMAL HIGH (ref 15–37)
Sodium: 139 mmol/L (ref 136–145)
Total Protein: 7.5 g/dL (ref 6.4–8.2)

## 2013-03-02 ENCOUNTER — Emergency Department: Payer: Self-pay | Admitting: Emergency Medicine

## 2013-03-02 LAB — CBC
HCT: 30.1 % — ABNORMAL LOW (ref 35.0–47.0)
MCH: 27.8 pg (ref 26.0–34.0)
MCV: 86 fL (ref 80–100)
Platelet: 93 10*3/uL — ABNORMAL LOW (ref 150–440)
RBC: 3.5 10*6/uL — ABNORMAL LOW (ref 3.80–5.20)
WBC: 6.5 10*3/uL (ref 3.6–11.0)

## 2013-03-02 LAB — URINALYSIS, COMPLETE
Glucose,UR: NEGATIVE mg/dL (ref 0–75)
Ph: 5 (ref 4.5–8.0)
Protein: NEGATIVE
RBC,UR: 5 /HPF (ref 0–5)
WBC UR: 22 /HPF (ref 0–5)

## 2013-03-02 LAB — COMPREHENSIVE METABOLIC PANEL
Alkaline Phosphatase: 252 U/L — ABNORMAL HIGH
Bilirubin,Total: 1.2 mg/dL — ABNORMAL HIGH (ref 0.2–1.0)
Calcium, Total: 8.5 mg/dL (ref 8.5–10.1)
Chloride: 104 mmol/L (ref 98–107)
Creatinine: 1.89 mg/dL — ABNORMAL HIGH (ref 0.60–1.30)
EGFR (Non-African Amer.): 27 — ABNORMAL LOW
SGOT(AST): 48 U/L — ABNORMAL HIGH (ref 15–37)
Total Protein: 8.5 g/dL — ABNORMAL HIGH (ref 6.4–8.2)

## 2013-03-02 LAB — BODY FLUID CELL COUNT WITH DIFFERENTIAL
Basophil: 0 %
Lymphocytes: 65 %
Neutrophils: 22 %
Other Cells BF: 0 %
Other Mononuclear Cells: 13 %

## 2013-03-09 ENCOUNTER — Emergency Department: Payer: Self-pay | Admitting: Emergency Medicine

## 2013-03-09 LAB — CBC WITH DIFFERENTIAL/PLATELET
Bands: 3 %
Eosinophil: 4 %
Lymphocytes: 19 %
MCH: 27.7 pg (ref 26.0–34.0)
MCHC: 30.5 g/dL — ABNORMAL LOW (ref 32.0–36.0)
Monocytes: 2 %
RBC: 1.24 10*6/uL — ABNORMAL LOW (ref 3.80–5.20)
RDW: 19.8 % — ABNORMAL HIGH (ref 11.5–14.5)
Segmented Neutrophils: 70 %
WBC: 13.1 10*3/uL — ABNORMAL HIGH (ref 3.6–11.0)

## 2013-03-09 LAB — PROTIME-INR: INR: 2.8

## 2013-03-09 LAB — CK-MB: CK-MB: 1.3 ng/mL (ref 0.5–3.6)

## 2013-03-09 LAB — COMPREHENSIVE METABOLIC PANEL
Albumin: 0.9 g/dL — ABNORMAL LOW (ref 3.4–5.0)
Anion Gap: 12 (ref 7–16)
BUN: 55 mg/dL — ABNORMAL HIGH (ref 7–18)
Chloride: 109 mmol/L — ABNORMAL HIGH (ref 98–107)
Creatinine: 3.09 mg/dL — ABNORMAL HIGH (ref 0.60–1.30)
EGFR (African American): 17 — ABNORMAL LOW
EGFR (Non-African Amer.): 15 — ABNORMAL LOW
Glucose: 243 mg/dL — ABNORMAL HIGH (ref 65–99)
Osmolality: 301 (ref 275–301)
Potassium: 5.2 mmol/L — ABNORMAL HIGH (ref 3.5–5.1)
SGOT(AST): 28 U/L (ref 15–37)
SGPT (ALT): 14 U/L (ref 12–78)
Total Protein: 4 g/dL — ABNORMAL LOW (ref 6.4–8.2)

## 2013-03-09 LAB — AMMONIA: Ammonia, Plasma: 138 mcmol/L — ABNORMAL HIGH (ref 11–32)

## 2013-03-14 LAB — CULTURE, BLOOD (SINGLE)

## 2013-04-05 DEATH — deceased

## 2014-01-17 IMAGING — CT CT HEAD WITHOUT CONTRAST
2 series · 16 of 30 positions shown, 20 images · non-contrast
Comparison: none

REASON FOR EXAM: weak
COMMENTS:

[Series 2: without · axial · non-contrast · 0.41mm/px · z∈[-51,+74]mm · 13 of 31 slices shown, 17 images]
[im 3/31  brain]
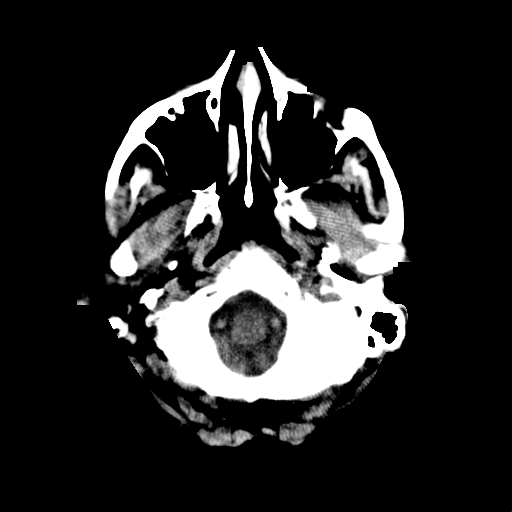
[im 3/31  bone]
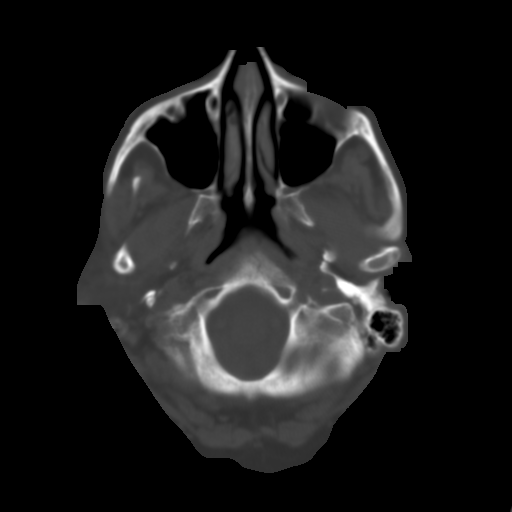
[im 5/31  brain]
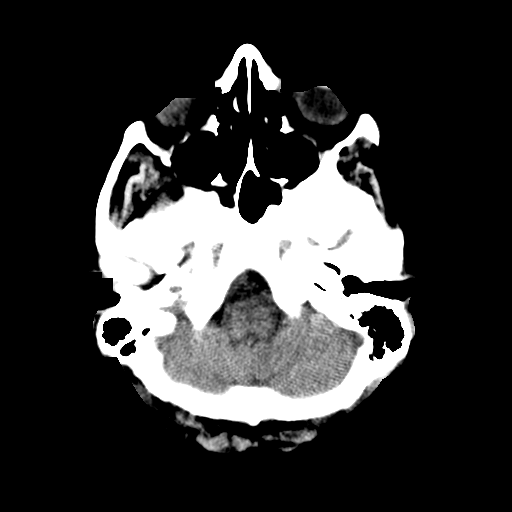
[im 7/31  brain]
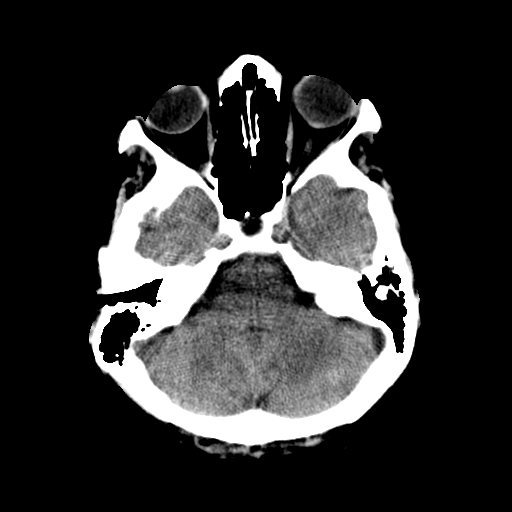
[im 9/31  brain]
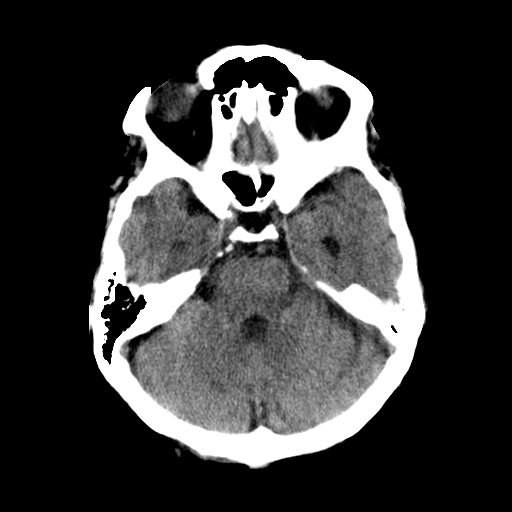
[im 11/31  brain]
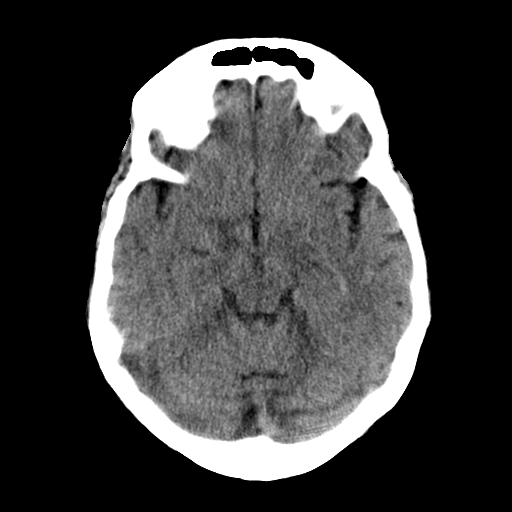
[im 11/31  bone]
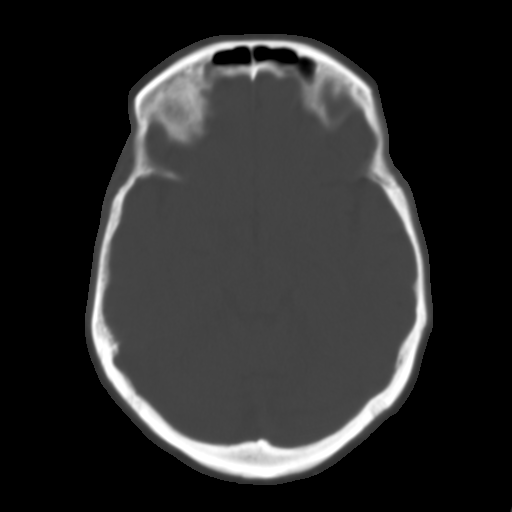
[im 13/31  brain]
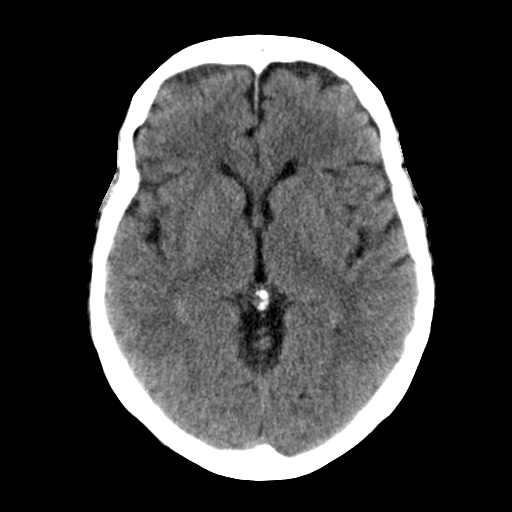
[im 16/31  brain]
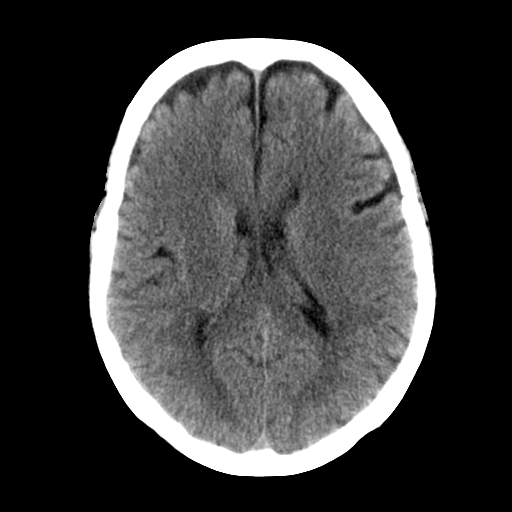
[im 18/31  brain]
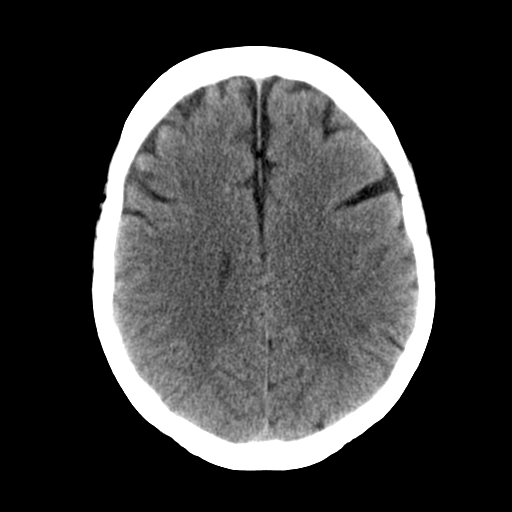
[im 20/31  brain]
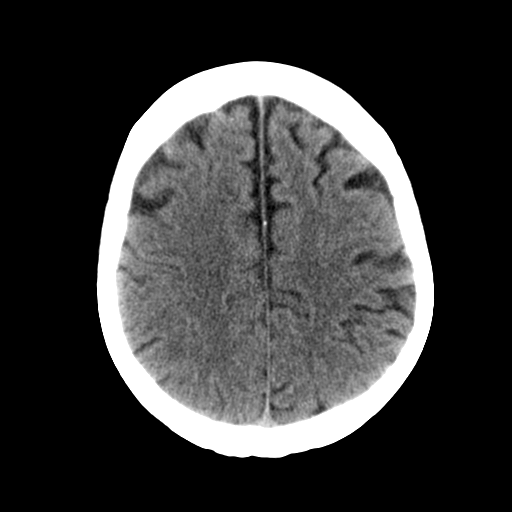
[im 20/31  bone]
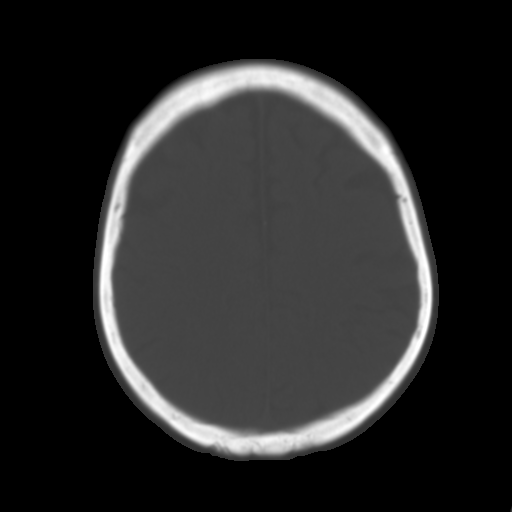
[im 22/31  brain]
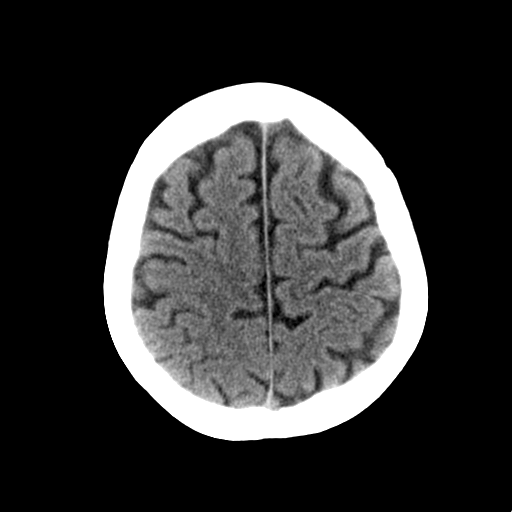
[im 24/31  brain]
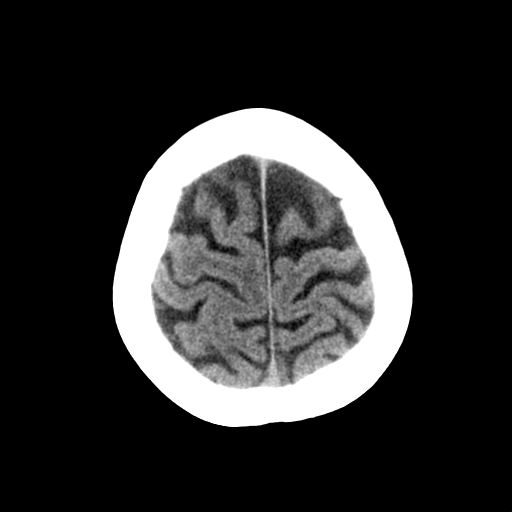
[im 26/31  brain]
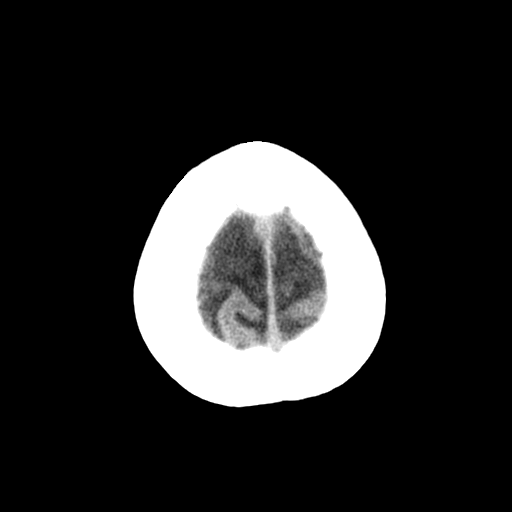
[im 28/31  brain]
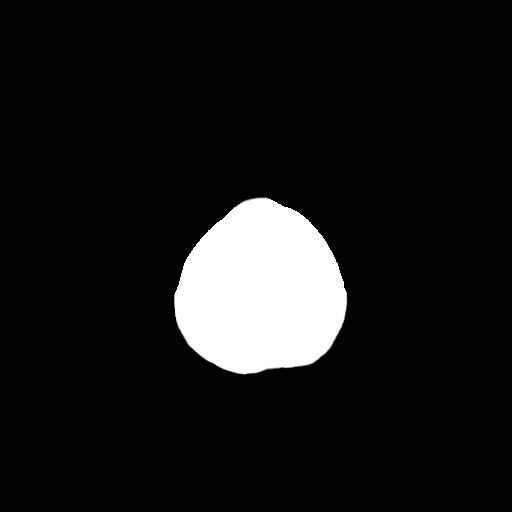
[im 28/31  bone]
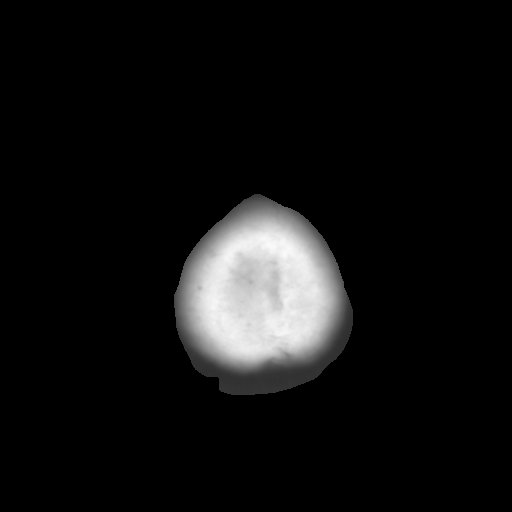

[Series 3: bone · axial · 0.41mm/px · z∈[-51,-11]mm · 3 of 31 slices shown]
[im 3/31  bone]
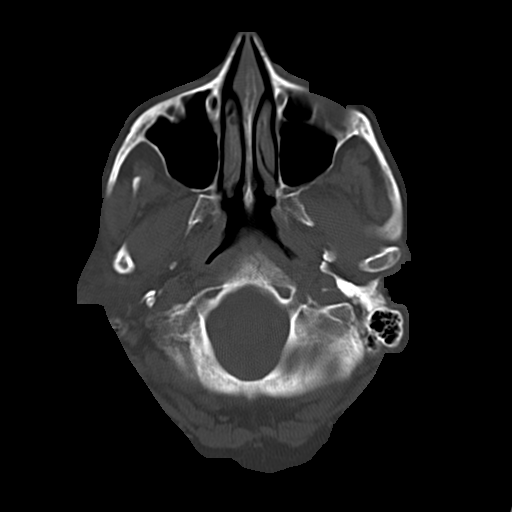
[im 7/31  bone]
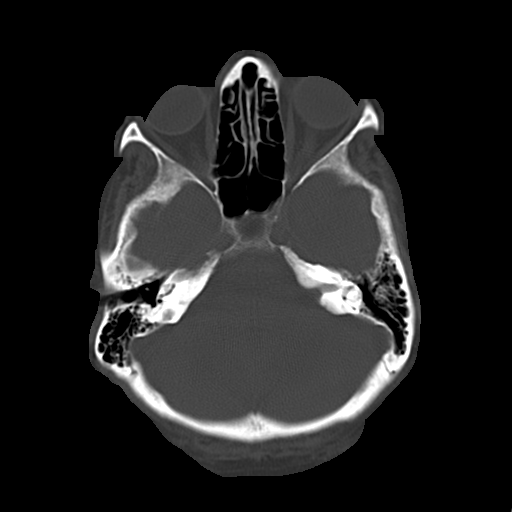
[im 11/31  bone]
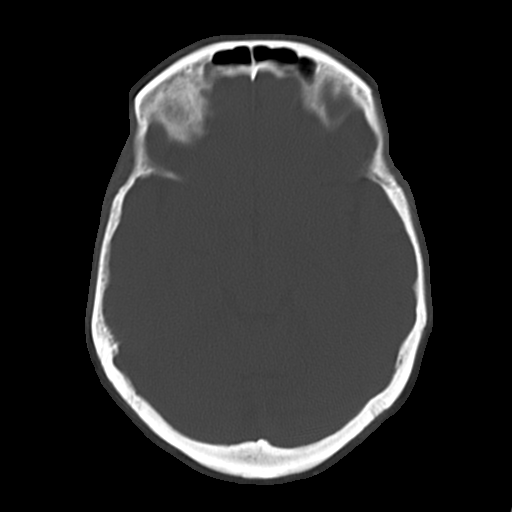

[16 of 30 positions shown; findings below may reference images not displayed]

PROCEDURE:     CT  - CT HEAD WITHOUT CONTRAST  - September 18, 2011  [DATE]

RESULT:     Axial noncontrast CT scanning was performed through the brain
with reconstructions at 5 mm intervals and slice thicknesses. Comparison is
made to the study 04 July, 2011.

The ventricles are normal in size and position. There is very mild
age-appropriate cerebral atrophy. There is no intracranial hemorrhage nor
intracranial mass effect nor evidence of an evolving ischemic event. At bone
window settings I do not see evidence of an acute skull fracture. The
observed portions of the paranasal sinuses and mastoid air cells are clear.
IMPRESSION: I see no acute intracranial abnormality.

A preliminary report was sent to the [HOSPITAL] the conclusion
of the study.

## 2014-07-23 NOTE — Op Note (Signed)
PATIENT NAME:  Nancy [name], Nancy [name] MR#:  [id number] DATE OF BIRTH:  12-14-1945  DATE OF PROCEDURE:  03/24/2012  PREOPERATIVE DIAGNOSIS: Visually significant cataract of the right eye.   POSTOPERATIVE DIAGNOSIS: Visually significant cataract of the right eye.   OPERATIVE PROCEDURE: Cataract extraction by phacoemulsification with implant of intraocular lens to right eye.   SURGEON: Galen ManilaWilliam Eliceo Gladu, MD.   ANESTHESIA:  1. Managed anesthesia care.  2. Topical tetracaine drops followed by 2% Xylocaine jelly applied in the preoperative holding area.   COMPLICATIONS: None.   TECHNIQUE:  Stop and chop.   DESCRIPTION OF PROCEDURE: The patient was examined and consented in the preoperative holding area where the aforementioned topical anesthesia was applied to the right eye and then brought back to the Operating Room where the right eye was prepped and draped in the usual sterile ophthalmic fashion and a lid speculum was placed. A paracentesis was created with the side port blade and the anterior chamber was filled with viscoelastic. A near clear corneal incision was performed with the steel keratome. A continuous curvilinear capsulorrhexis was performed with a cystotome followed by the capsulorrhexis forceps. Hydrodissection and hydrodelineation were carried out with BSS on a blunt cannula. The lens was removed in a stop and chop technique and the remaining cortical material was removed with the irrigation-aspiration handpiece. The capsular bag was inflated with viscoelastic and the Tecnis ZCB00 17.5-diopter lens, serial number [id number] was placed in the capsular bag without complication. The remaining viscoelastic was removed from the eye with the irrigation-aspiration handpiece. The wounds were hydrated. The anterior chamber was flushed with Miostat and the eye was inflated to physiologic pressure. The wounds were found to be water tight. The eye was dressed with Vigamox. The patient was given protective  glasses to wear throughout the day and a shield with which to sleep tonight. The patient was also given drops with which to begin a drop regimen today and will follow-up with me in one day.  ____________________________ Jerilee FieldWilliam L. Kaysin Brock, MD wlp:sb D: 03/24/2012 10:43:00 ET T: 03/24/2012 15:45:09 ET JOB#: [id number]  cc: Sheily Lineman L. Evianna Chandran, MD, <Dictator> Jerilee FieldWILLIAM L Hilari Wethington MD ELECTRONICALLY SIGNED 04/04/2012 13:42

## 2014-07-23 NOTE — Discharge Summary (Signed)
PATIENT NAME:  Nancy [name], Nancy [name] MR#:  [id number] DATE OF BIRTH:  1946-01-19  DATE OF ADMISSION:  01/30/2012 DATE OF DISCHARGE:  02/01/2012  PRIMARY CARE PHYSICIAN: Dennison Mascot, MD  DISCHARGE DIAGNOSES:  1. Hyperkalemia with bradycardia.  2. Acute renal failure on chronic kidney disease.  3. Urinary tract infection. 4. Liver cirrhosis with thrombocytopenia. 5. Diabetes.  6. Chronic obstructive pulmonary disease.   CODE STATUS: FULL CODE.   CONDITION: Stable.   HOME MEDICATIONS:  1. Diltiazem 120 mg p.o. daily.  2. Glimepiride 4 mg p.o. daily.  3. Citalopram 20 mg p.o. daily.  4. Bisoprolol/HCTZ 10 mg/6.25 mg p.o. daily. 5. Symbicort 80 mcg/4.5 mcg inhalation 2 puffs twice a day. 6. Spiriva 18 mcg inhalation one inhalation once a day.  7. Cyclobenzaprine 10 mg 1 tablet twice a day. 8. Gabapentin 300 mg p.o. one cap in the morning and evening and two caps at bedtime.  9. Oxycodone 10 mg p.o. twice a day p.r.n.  10. ProAir HFA 90 mcg inhalation 2 puffs inhaled p.r.n. for shortness of breath and wheezing. 11. NovoLog sliding scale. 12. Lantus 20 units subcutaneous at bedtime. 13. Levothyroxine 100 mcg p.o. daily.  14. Zofran 4 mg p.o. every 8 hours p.r.n.  15. Ambien 5 mg p.o. at bedtime. 16. Neomycin 500 mg p.o. tablets 2 tablets every eight hours.  17. Lactulose 10 grams/15 mL oral syrup, 30 mL once a day.  MEDICATIONS TO STOP:  1. Aldactone 25 mg p.o. daily.  2. Lasix 20 mg p.o. daily.  3. Keflex 500 mg p.o. tablets 1 tab three times daily.  DISCHARGE INSTRUCTIONS: The patient needs home health with nurse and physical therapy.   HOME OXYGEN: 2 liters by nasal cannula.    DIET: Low sodium, ADA diet.   ACTIVITY: As tolerated.   FOLLOW-UP CARE: Follow-up with PCP within 1 to 2 weeks   REASON FOR ADMISSION: Lethargy and frequent falls.   HOSPITAL COURSE: The patient is a 69 year old Caucasian female with history of chronic liver cirrhosis with multiple admissions for  hepatitic encephalopathy, diabetes, hypertension, hyperlipidemia, hypothyroidism, and chronic obstructive pulmonary disease on 2 liters home oxygen who presented to the ED with frequent falls and lethargy. The patient was noted to have sinus bradycardia in the 40s. Potassium was 6.9. In the ED, she was treated with insulin D50 and calcium gluconate. She received Kayexalate. Her BNP increased to 27 and creatinine was elevated at 2.11, but her baseline creatinine was 1.5 to 1.6. So she was admitted for hyperkalemia and acute renal failure with CKD. After admission Aldactone and Lasix were on hold due to acute renal failure and hyperkalemia. Repeat potassium decreased to 5.4 so the patient was treated with one dose of Kayexalate, 30 grams, again, and potassium decreased to 4.4 today. For acute renal failure, the patient has been treated with IV fluid support. BUN decreased to 20 and creatinine decreased to 1.42. The patient has frequent falls, possibly due to weakness and dehydration, so we requested a PT evaluation and they suggested the patient needs home health and physical therapy at home. The patient's diabetes has been controlled with Lantus and sliding scale. The patient has no complaints today. Vital signs are stable. She is clinically stable and will be discharged to home today. I discussed the patient's discharge plan with the patient and the case manager.  TIME SPENT: About 35 minutes.  ____________________________ Shaune Pollack, MD qc:slb D: 02/01/2012 14:17:00 ET T: 02/02/2012 12:09:07 ET JOB#: [id number]  cc: Dennison Mascot,  MD Shaune PollackQing Gentri Guardado, MD, <Dictator> Shaune PollackQING Ashraf Mesta MD ELECTRONICALLY SIGNED 02/02/2012 14:21

## 2014-07-23 NOTE — Op Note (Signed)
PATIENT NAME:  Nancy FossaSMITH, Orel E MR#:  [id number] DATE OF BIRTH:  March 27, 1946  DATE OF PROCEDURE:  01/18/2012  PREOPERATIVE DIAGNOSIS: Visually significant cataract of the left eye.   POSTOPERATIVE DIAGNOSIS: Visually significant cataract of the left eye.   OPERATIVE PROCEDURE: Cataract extraction by phacoemulsification with implant of intraocular lens to left eye.   SURGEON: Galen ManilaWilliam Zariya Minner, MD.   ANESTHESIA:  1. Managed anesthesia care.  2. Topical tetracaine drops followed by 2% Xylocaine jelly applied in the preoperative holding area.   COMPLICATIONS: None.   TECHNIQUE:  Stop-and-chop    DESCRIPTION OF PROCEDURE: The patient was examined and consented in the preoperative holding area where the aforementioned topical anesthesia was applied to the left eye and then brought back to the Operating Room where the left eye was prepped and draped in the usual sterile ophthalmic fashion and a lid speculum was placed. A paracentesis was created with the side port blade and the anterior chamber was filled with viscoelastic. A near clear corneal incision was performed with the steel keratome. A continuous curvilinear capsulorrhexis was performed with a cystotome followed by the capsulorrhexis forceps. Hydrodissection and hydrodelineation were carried out with BSS on a blunt cannula. The lens was removed in a stop-and-chop technique and the remaining cortical material was removed with the irrigation-aspiration handpiece. The capsular bag was inflated with viscoelastic and the Tecnis ZCB00 17.5-diopter lens, serial number [id number] was placed in the capsular bag without complication. The remaining viscoelastic was removed from the eye with the irrigation-aspiration handpiece. The wounds were hydrated. The anterior chamber was flushed with Miostat and the eye was inflated to physiologic pressure. The wounds were found to be water tight. The eye was dressed with Vigamox. The patient was given protective glasses  to wear throughout the day and a shield with which to sleep tonight. The patient was also given drops with which to begin a drop regimen today and will follow-up with me in one day.   ____________________________ Jerilee FieldWilliam L. Norberta Stobaugh, MD wlp:drc D: 01/18/2012 11:48:32 ET T: 01/18/2012 11:57:00 ET JOB#: [id number]  cc: Kindrick Lankford L. Lizette Pazos, MD, <Dictator> Jerilee FieldWILLIAM L Reida Hem MD ELECTRONICALLY SIGNED 01/24/2012 13:46

## 2014-07-23 NOTE — H&P (Signed)
PATIENT NAME:  Nancy [name], Nancy [name] MR#:  [id number] DATE OF BIRTH:  Mar 03, 1946  DATE OF ADMISSION:  01/30/2012  PRIMARY CARE PHYSICIAN: Dennison MascotLemont Morrisey, MD   CHIEF COMPLAINT: Lethargy and frequent falls.  HISTORY OF PRESENT ILLNESS: The patient is a 69 year old Caucasian female with a past medical history of chronic liver disease, multiple admissions in the past for hepatic encephalopathy and following up [facility], insulin dependent diabetes mellitus, hypertension, hyperlipidemia, hypothyroidism, COPD on 2 liters of oxygen as needed, and gastroesophageal reflux disease who was brought into the ER after she sustained two falls today and one fall yesterday. The patient is reporting that she is feeling weak and her legs give way when she's trying to walk. She was in her usual state of health until two days ago. Today while she was trying to lean forward to lift something she lost her balance and fell one time. The second time her legs gave way and she fell. The patient was so weak she could not get up on her own. The patient lives in a senior apartment complex. The patient denies any head trauma but as she landed on her abdomen she is complaining of diffuse abdominal pain. The patient was brought into the ER and the patient was found to be in sinus bradycardia with a heart rate at around 40's. Initial potassium level was high at 6.9. The patient was immediately given insulin, D50, and calcium gluconate. Eventually she has received Kayexalate also. The patient subsequently had bowel movements. Repeat potassium is ordered at 1 a.m. for October 28th. The patient also received one liter of Fleet's bolus in the ER. Her renal function is abnormal with a BUN of 27 and creatinine 2.11. The patient's previous creatinine is around 1.5 to 1.6 in the past. The patient's daughter is at the bedside and she is reporting that if she's not sick she is quite independent and can take care of herself. The  patient has history of liver cirrhosis and follows up with [facility]. The patient denies any chest pain or shortness of breath. During my examination, the patient is awake, alert, and answering most of the questions appropriately. Daughter is reporting that after giving lactulose for hyperkalemia, the patient's condition significantly improved and she is more awake and alert. The patient is reporting that she takes lactulose on a regular basis and her bowel movements are anywhere from 3 to 7.      PAST MEDICAL HISTORY: 1. Chronic liver disease. Biopsy in 2011 showed evidence of steatohepatitis and acute injury of unknown etiology. 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  2. Gastroesophageal reflux disease. 3. Hypothyroidism. 4. Insulin dependent diabetes mellitus. 5. Hyperlipidemia. 6. Hypertension. 7. Chronic obstructive pulmonary disease, on 2 liters of oxygen as needed.  ALLERGIES: The patient is allergic to aspirin and Tylenol.   PAST SURGICAL HISTORY: Liver biopsy in 2011.   HOME MEDICATIONS: 1. Spiriva 18 mcg inhalation once a day. 2. Spironolactone 25 mg one tablet p.o. once a day. 3. Ambien 10 mg p.o. once daily. 4. Symbicort 80 mcg 2 puff inhalation 2 times a day. 5. ProAir 2 puff inhalation q.4 hours as needed. 6. Oxycodone 10 mg two times a day.  7. Zofran 4 mg one tablet p.o. q.8 hours. 8. NovoLog sliding scale.  9. Neomycin 500 mg 2 tablets p.o. q.8 hours.  10. Levothyroxine 100 mcg once daily. 11. Lasix 20  mg one tablet p.o. once daily. 12. Lantus 50 units twice a day.  13. Lactulose 30 mL p.o. once a day. 14. Keflex one capsule p.o. three times a day. 15. Potassium 10 mEq one tablet p.o. once a day. 16. Glimepiride 4 mg once a day. 17. Gabapentin 300 mg b.i.d. 18. Diltiazem one capsule p.o. once a day, dose unknown. 19. Cyclobenzaprine one tablet p.o. two times  day. 20. Citalopram 20 mg once a day. 21. Bisoprolol one tablet p.o. once a day.   SOCIAL HISTORY: The patient is residing in a senior apartment complex. She used to smoke but quit smoking. Denies any alcohol abuse.   FAMILY HISTORY: Reviewed. Father had history of hypertension. Coronary artery disease runs in her family.   REVIEW OF SYSTEMS: The patient denies any fever. Complains of weakness. Denies any weight loss or weight gain. EYES: Denies any blurry vision, redness, inflammation, glaucoma, or cataracts. ENT: Denies tinnitus, ear pain, hearing loss, or seasonal allergies. Denies any epistaxis or redness of the oropharynx. RESPIRATORY: Denies cough, wheezing, hemoptysis, or dyspnea. CARDIOVASCULAR: Denies chest pain, orthopnea, edema, arrhythmia, or palpitations. GI: Denies nausea or vomiting. The patient has chronic history of diarrhea from lactulose. Complaining of diffuse abdominal pain. Denies hematemesis, melena, or ulcers. Positive gastroesophageal reflux disease. GENITOURINARY: Denies dysuria or hematuria. ENDOCRINE: Denies polyuria or nocturia. The patient has history of hypothyroidism. HEMATOLOGIC: Positive thrombocytopenia. Positive easy bruising. MUSCULOSKELETAL: Denies any pain in the neck, back, shoulders, knees, or hips. NEUROLOGIC: Denies any numbness, weakness, dysarthria, or epilepsy. Denies vertigo, ataxia, or dementia. PSYCHOLOGIC: Denies anxiety. Complaining of insomnia. Takes Ambien. Denies bipolar disorder.   PHYSICAL EXAMINATION:   VITAL SIGNS: Temperature 98.1, pulse initially 48 but eventually went from 50 to 60, respiratory rate 18, blood pressure 117/64, pulse oximetry 95 to 100%.  GENERAL APPEARANCE: Well built, well nourished not in acute distress.   HEENT: Normocephalic and atraumatic. Pupils are equal, reactive to light and accommodation. No scleral icterus. No conjunctival injection.   NECK: Supple. No JVD. No thyromegaly.   LUNGS: Clear to auscultation  bilaterally. No crackles. No wheezing. No rales.   CARDIOVASCULAR: S1, S2. Regular rate and rhythm. No S3, S4 tenderness. Point of maximal impulse intact.   GI: Soft. Bowel sounds positive in four quadrants. Diffuse abdominal tenderness is present. No rebound tenderness is present. Left lower quadrant bruising is present.   NEUROLOGIC: Awake, alert, and oriented x3. Answering all questions appropriately. Motor and sensory are grossly intact.   EXTREMITIES: No edema. No cyanosis.   BACK: No CVA tenderness.   PSYCHIATRIC: Appropriate affect. Intact judgment and insight.   LABORATORY, DIAGNOSTIC, AND RADIOLOGICAL DATA: CAT scan of the abdomen and pelvis revealed soft tissue injury within the subcutaneous fat of the left lateral abdominal wall. Evaluation is limited without intravenous contrast. Ascending and proximal transverse colon mildly distended with stool. Findings are consistent with cirrhosis. Mild splenomegaly. Numerous moderate intraperitoneal lymph nodes with adjacent inflammatory lymph nodes with inflammatory fat stranding. The appearance was similar to prior CAT scan of the abdomen dated 03/18/2010. Area of peritoneal fibrosis is a consideration.   Glucose 273, sodium 132, potassium 6.9, chloride 102, CO2 21, BUN 27, creatinine 2.11, anion gap 9, osmolality serum 279, calcium 9.7, GFR 24, total protein 9.9, albumin 3.0, total bilirubin 0.7, alkaline phosphatase 310, SGOT 54, ALT 37, WBC 9.4, hemoglobin 11.7, hematocrit 36.5, platelet count 90,000, MCV 86. PT 14.9. INR 1.1. Urinalysis leukocyte esterase trace, blood negative, color yellow cloudy, glucose greater than 500, hyaline  cast 28 per low-power field.   ASSESSMENT AND PLAN:  1. Hyperkalemia with sinus bradycardia. Will hold spironolactone and potassium supplements which are her home medications. The patient has received insulin, D50, calcium gluconate, and Kayexalate and had two bowel movements. Repeat potassium at 1 a.m. Repeat  CMP in a.m.  2. Acute kidney injury probably from dehydration. Will continue IV fluids and hold her Lasix.  3. Frequent falls probably from dehydration. Provide gentle hydration with IV fluids with close monitoring for symptoms and signs of fluid overload. PT evaluation is recommended.  4. Possible urinary tract infection on urinalysis. Will start her on Rocephin 1 gram IV once daily.  5. General abdominal pain following fall. Will provide pain management with morphine IV as needed for moderate pain. 6. Chronic history of liver cirrhosis with thrombocytopenia. Ammonia level pending at this time. The patient's mentation is within normal limits. Possibility of hepatic encephalopathy is very low. Will provide GI prophylaxis. 7. Insulin dependent diabetes mellitus. Will continue Lantus and provide her sliding scale with Accu-Cheks.  8. DVT prophylaxis will be provided with SCDs.   CODE STATUS: She is FULL CODE.  The diagnosis and plan was discussed in detail with the patient and her daughter at the bedside. They voiced their understanding of the plan.   TOTAL TIME SPENT ON ADMISSION: 60 minutes.   ____________________________ Ramonita Lab, MD ag:drc D: 01/30/2012 23:38:49 ET T: 01/31/2012 06:48:40 ET JOB#: [id number]  cc: Ramonita Lab, MD, <Dictator> Dennison Mascot, MD Ramonita Lab MD ELECTRONICALLY SIGNED 02/12/2012 6:57

## 2014-07-23 NOTE — Consult Note (Signed)
Chief Complaint:   Subjective/Chief Complaint Pt c/o palp weakness no cp. She is still alittle confused.   VITAL SIGNS/ANCILLARY NOTES: **Vital Signs.:   02-Apr-13 15:39   Vital Signs Type Routine   Temperature Temperature (F) 98.1   Celsius 36.7   Temperature Source oral   Pulse Pulse 61   Pulse source if not from Vital Sign Device per [employer]am[employer]   Respirations Respirations 18   Systolic BP Systolic BP 762   Diastolic BP (mmHg) Diastolic BP (mmHg) 63   Mean BP 81   BP Source  if not from Vital Sign Device [employer]amap   Pulse Ox % Pulse Ox % 98   Pulse Ox Activity Level  At rest   Oxygen Delivery Room Air/ 21 %  *Intake and Output.:   Daily 02-Apr-13 07:00   Grand Totals Intake:  2636.8 Output:  825    Net:  1811.8 24 Hr.:  1811.8   Oral Intake      In:  950   IV (Primary)      In:  1118.7   IV (Primary)      In:  473.1   IV (Primary)      In:  20   IV (Primary)      In:  75   Urine ml     Out:  825   Length of Stay Totals Intake:  2955.8 Output:  825    Net:  2130.8  Rehab Summary:   02-Apr-13 10:01   Anticipated Discharge Disposition  home w/ home health; SNF/STR; Will determine based on progress   Brief Assessment:   Cardiac Irregular  -- JVD  --Gallop    Respiratory normal resp effort  clear BS  no use of accessory muscles    Gastrointestinal Normal    Gastrointestinal details normal Soft  Nontender   Lab Results: Thyroid:  02-Apr-13 05:48    Thyroid Stimulating Hormone  5.25 (0.45-4.50 (International Unit)  ----------------------- Pregnant patients have  different reference  ranges for TSH:  - - - - - - - - - -  Pregnant, first trimetser:  0.36 - 2.50 uIU/mL)  Routine Chem:  02-Apr-13 05:48    Magnesium, Serum  1.1 (1.8-2.4 THERAPEUTIC RANGE: 4-7 mg/dL TOXIC: > 10 mg/dL  -----------------------)   Glucose, Serum  236   BUN  20   Creatinine (comp)  1.32   Sodium, Serum 141   Potassium, Serum 3.7   Chloride, Serum  110   CO2, Serum  19   Calcium  (Total), Serum  7.2   Anion Gap 12   Osmolality (calc) 292   eGFR (African American)  52   eGFR (Non-African American)  43 (eGFR values <58m/min/1.73 m2 may be an indication of chronic kidney disease (CKD). Calculated eGFR is useful in patients with stable renal function. The eGFR calculation will not be reliable in acutely ill patients when serum creatinine is changing rapidly. It is not useful in patients on dialysis. The eGFR calculation may not be applicable to patients at the low and high extremes of body sizes, pregnant women, and vegetarians.)   Result Comment plt - VERIFIED BY SMEAR ESTIMATE  Result(s) reported on 06 Jul 2011 at 06:48AM.  Routine Coag:  02-Apr-13 05:48    Activated PTT (APTT)  127.4 (A HCT value >55% may artifactually increase the APTT. In one study, the increase was an average of 19%. Reference: "Effect on Routine and Special Coagulation Testing Values of Citrate Anticoagulant Adjustment in Patients with High HCT  Values." American Journal of Clinical Pathology 8786;767:209-470.)  Routine Hem:  02-Apr-13 05:48    WBC (CBC)  3.5   RBC (CBC)  3.37   Hemoglobin (CBC)  9.9   Hematocrit (CBC)  30.2   Platelet Count (CBC)  81   MCV 89   MCH 29.3   MCHC 32.8   RDW  16.6   Neutrophil % 53.2   Lymphocyte % 30.3   Monocyte % 11.3   Eosinophil % 3.9   Basophil % 1.3   Neutrophil # 1.8   Lymphocyte # 1.1   Monocyte # 0.4   Eosinophil # 0.1   Basophil # 0.0   Radiology Results: XRay:    31-Mar-13 21:49, Chest PA and Lateral   Chest PA and Lateral    REASON FOR EXAM:    cva sx  COMMENTS:       PROCEDURE: DXR - DXR CHEST PA (OR AP) AND LATERAL  - Jul 04 2011  9:49PM     RESULT: Comparison is made to the previous examination performed on 06 June 2011.    The lungs are clear. The heart and pulmonary vessels are normal. The bony   and mediastinal structures are unremarkable. There is no effusion. There   is no pneumothorax or evidence of congestive  failure.    IMPRESSION:  No acute cardiopulmonary disease.        Verified By: Sundra Aland, M.D., MD  MRI:    01-Apr-13 15:25, MRI Brain Without Contrast   MRI Brain Without Contrast    REASON FOR EXAM:    ams  COMMENTS:       PROCEDURE: MR  - MR BRAIN WO CONTRAST  - Jul 05 2011  3:25PM     RESULT:     Technique: Multiplanar and multisequence imaging of the brain was   obtained without the administration of gadolinium.    Findings: Diffusion-weighted imaging demonstrates no evidence of foci of   restricted diffusion. There is no evidence of intra-axial or extra-axial   fluid collections. Intermediate areas of increased T2-signal project   within the subcortical and periventricular white matter regions. There is   no evidence of subfalcine or tonsillar herniation. Evaluation of the     sella demonstrates diffuse increased T2-signal and thinning with near   absence of the pituitary. These findings may represent the sequela of a   partial empty sella. The cerebellopontine angle regions and visualized   portions of the seventh and eighth cranial nerves demonstrate no signal   abnormalities. The cerebellum, pons, and midbrain demonstrate no signal   abnormalities.    IMPRESSION:      1. Mild involutional changes without evidence of acute abnormalities.   2. 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   Clinical correlation recommended.    Thank you for the opportunity to contribute to the care of your patient.       Verified By: Mikki Santee, M.D., MD  Cardiology:    31-Mar-13 18:28, ECG   Ventricular Rate 85   Atrial Rate 85   P-R Interval 160   QRS Duration 76   QT 360   QTc 428   P Axis -25   R Axis 9   T Axis -70   ECG interpretation    *** Poor data quality, interpretation may be adversely affected  Normal sinus rhythm  ST & T wave abnormality, consider inferior ischemia  Abnormal ECG  When  compared with ECG of 03-Jun-2011  11:35,  Nonspecific T wave abnormality now evident in Anterolateral leads  ----------unconfirmed----------  Confirmed by OVERREAD, NOT ([id number]), editor PEARSON, BARBARA ([id number]) on 07/05/2011 3:24:58 PM   ECG     01-Apr-13 00:27, ED ECG   Ventricular Rate 137   Atrial Rate 220   QRS Duration 80   QT 308   QTc 465   R Axis 9   T Axis -103   ECG interpretation    Atrial fibrillation with rapid ventricular response  ST & T wave abnormality, consider inferior ischemia or digitalis effect  ST & T wave abnormality, consider anterolateral ischemia or digitalis effect  Abnormal ECG  When compared with ECG of 04-Jul-2011 18:28,  Atrial fibrillation has replaced Sinus rhythm  Vent. rate has increased BY  52 BPM  ----------unconfirmed----------  Confirmed by OVERREAD, NOT ([id number]), editor PEARSON, BARBARA ([id number]) on 07/08/2011 11:11:50 AM   ED ECG   CT:    31-Mar-13 21:37, CT Head Without Contrast   CT Head Without Contrast    REASON FOR EXAM:    difficulty speaking  - rm 11  COMMENTS:   May transport without cardiac monitor    PROCEDURE: CT  - CT HEAD WITHOUT CONTRAST  - Jul 04 2011  9:37PM     RESULT: Comparison is made to a prior study dated 05/03/2011.    Technique: Helical noncontrasted 5 mm sections were obtained from the   skull base to the vertex.    There is no evidence of intra-axial or extra-axial fluid collections or   evidence of acute hemorrhage. Mild diffuse cortical atrophy is   appreciated as well as mild areas of low-attenuation within the   subcortical, and deep, and periventricular white matter regions. The   ventricles and cisterns are patent. There is no evidence of mass effect.     The paranasal sinuses and mastoid air cells are patent. There is no   evidence of a depressed skull fracture.    IMPRESSION:      1. Mild involutional changes without evidence of acute abnormalities.  2. Dr. Cinda Quest of the Emergency Department was informed of these findings   via preliminary  faxed report.      Thank you for the opportunity to contribute to the care of your patient.         Verified By: Mikki Santee, M.D., MD   Assessment/Plan:  Invasive Device Daily Assessment of Necessity:   Does the patient currently have any of the following indwelling devices? none   Assessment/Plan:   Assessment IMP AFIB AMS DM HTN COPD CRI Falls/Balance  .    Plan PLAN Agree with admission Cont tele ASA Not a good coumadin candidate F/U EKGs ROMI Agree with MRI Continue DM control Bp control with bisoprlol F/U Liver enzymes Inhalers for COPD I do not rec cath at this point   Electronic Signatures: Lujean Amel D (MD)  (Signed 10-Sep-13 12:26)  Authored: Chief Complaint, VITAL SIGNS/ANCILLARY NOTES, Brief Assessment, Lab Results, Radiology Results, Assessment/Plan   Last Updated: 10-Sep-13 12:26 by Yolonda Kida (MD)

## 2014-07-26 NOTE — Consult Note (Signed)
Brief Consult Note: Diagnosis: LEFT HIP/THIGH PAIN undetermined origin.   Patient was seen by consultant.   Orders entered.   Comments: Xray ordered possible hematoma in hip muscles could do this, but start with hip xray if normal, rec MRI pelvis to evaluate for mass/hematoma.  Electronic Signatures: Leitha SchullerMenz, Hernan Turnage J (MD)  (Signed 30-Jul-14 17:27)  Authored: Brief Consult Note   Last Updated: 30-Jul-14 17:27 by Leitha SchullerMenz, Jakeya Gherardi J (MD)

## 2014-07-26 NOTE — Consult Note (Signed)
Chief Complaint:  Subjective/Chief Complaint Pt c/o fatigue.  BPE reviewed normal.  VQ negative for PE, changes COPD.  Denies abdominal pain, nausea or vomiting.   Brief Assessment:  Cardiac Regular  + LE edema   Respiratory normal resp effort   Gastrointestinal Normal   Gastrointestinal details normal Soft  Bowel sounds normal  No rebound tenderness  No gaurding  mod distention   Additional Physical Exam GEN: A/Ox3. NAD. Skin: Pink, warm & dry Ext: No asterixis   Lab Results: Hepatic:  21-May-14 04:03   Bilirubin, Total 0.5  Alkaline Phosphatase  249  SGPT (ALT) 39  SGOT (AST)  84  Total Protein, Serum 7.3  Albumin, Serum  2.2  Routine Chem:  21-May-14 04:03   Glucose, Serum 74  BUN  34  Creatinine (comp)  1.48  Sodium, Serum 140  Potassium, Serum 4.5  Chloride, Serum  109  CO2, Serum 27  Calcium (Total), Serum 8.9  Osmolality (calc) 286  eGFR (African American)  42  eGFR (Non-African American)  36 (eGFR values <65m/min/1.73 m2 may be an indication of chronic kidney disease (CKD). Calculated eGFR is useful in patients with stable renal function. The eGFR calculation will not be reliable in acutely ill patients when serum creatinine is changing rapidly. It is not useful in  patients on dialysis. The eGFR calculation may not be applicable to patients at the low and high extremes of body sizes, pregnant women, and vegetarians.)  Anion Gap  4  Routine Hem:  21-May-14 04:03   WBC (CBC)  2.9  RBC (CBC)  2.98  Hemoglobin (CBC)  8.6  Hematocrit (CBC)  26.0  Platelet Count (CBC)  75  MCV 87  MCH 29.0  MCHC 33.3  RDW  16.7  Neutrophil % 57.5  Lymphocyte % 26.4  Monocyte % 10.8  Eosinophil % 3.8  Basophil % 1.5  Neutrophil # 1.7  Lymphocyte #  0.8  Monocyte # 0.3  Eosinophil # 0.1  Basophil # 0.0 (Result(s) reported on 23 Aug 2012 at 04:57AM.)   Assessment/Plan:  Assessment/Plan:  Assessment Dysphagia:  Normal BPE.  No indication for EGD or esophageal  dilation. 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

## 2014-07-26 NOTE — Discharge Summary (Signed)
PATIENT NAME:  Nancy [name], [name] MR#:  [id number] DATE OF BIRTH:  09/21/1945  DATE OF ADMISSION:  08/06/2012 DATE OF DISCHARGE:  08/09/2012  DISCHARGE DIAGNOSES: Acute renal failure, urinary tract infection, altered mental status due to hepatic encephalopathy plus urinary tract infection; improved, hypertension, diabetes, chronic obstructive pulmonary disease, on home oxygen; hypothyroidism, gastroesophageal reflux disease, depression.   CONDITION ON DISCHARGE: Stable.   CODE STATUS: FULL CODE.   MEDICATIONS ON DISCHARGE: Glimiperide 4 mg once a day, citalopram 20 mg once a day, Symbicort 2 puffs inhaled 2 times a day, cyclobenzaprine 10 mg oral tablet 2 times a day, gabapentin 300 mg, 1 capsule in the morning, evening, and 2 capsules at bedtime, Lantus  20 units subcutaneous once a day at bedtime, ondansetron 4 mg oral tablet every 8 hours as needed for nausea and vomiting, levothyroxine 100 mcg once a day, ProAir 2 puffs inhaled every 6 hours as needed for shortness of breath, Spiriva 18 mcg inhalation capsule once a day, zolpidem 5 mg oral tablet once a day, neomycin 500 mg oral tablet, 2 tablets orally every  8 hours, oxycodone 10 mg oral tablet 2 times a day as needed for pain, amlodipine 10 mg oral tablet once a day, cefuroxime 500 mg oral tablet 2 times a day for 3 days.   HOME OXYGEN: Yes; portable tank: Yes. Two liters nasal cannula oxygen daily.  DIET ON DISCHARGE: Low-sodium, low-fat, cholesterol-controlled and carbohydrate-controlled ADA diet. Consistency: Regular.   ACTIVITY LIMITATION: As tolerated.   TIMEFRAME TO FOLLOWUP: Within 1 to 2 weeks, with regular followups with primary care physician, [facility]enn[facility]ont [facility], [address], [place].  HISTORY OF PRESENT ILLNESS: A 69 year old female with multiple past medical history including liver cirrhosis, COPD, on oxygen, hypertension, hyperlipidemia, diabetes, hypothyroid, acid reflux, presented with  feeling lousy, swelling in the feet, feeling weak and poor energy. In the ER she was found to have bradycardia, relative to hypotension, and acute renal failure, elevated ammonia level, and so she was admitted for further management. She was given IV fluid for her renal failure, and on initial testing she was found to have a UTI.   HOSPITAL COURSE AND STAY:  1.  Acute renal failure due to dehydration: IV fluid and hydrated, and her renal failure reversed. Her creatinine on discharge came near-normal.  2.  UTI: Urine culture grew gram negative rods, sensitive to Rocephin. We continued that in the hospital, and discharged for 3 more days of antibiotics to finish a 7-day course.  3.  Bradycardia and hypotension: On presentation she was on bisoprolol and Cardizem, and was dehydrated; maybe these were the causes of her having altered mental status, feeding weak. We discontinued these medications, monitored on telly. Echocardiogram was done which was noncontributory, and so we discharged without those medicines.  4.  Hypertension: After holding her bisoprolol and Cardizem her blood pressure went up. Her heart rate remained in the range of 60 and normal, so we started on amlodipine to control her blood pressure better.  5.  Hepatic encephalopathy: Ammonia level was 75. The patient was taking neomycin and lactulose. We added rifaxamin while she was in the hospital and she had a significant improvement in mental status. Maybe part of that was also due to dehydration, and UTI. After the improvement we just discharged her on an increased dose of lactulose and neomycin.  6. Diabetes: We continued Lantus with sliding-scale. Glimiperide was initially held due to renal failure, but once renal function started  working good, IV started.  7.  Hypothyroidism: We continued levothyroxine in the hospital; remained stable.  8.  Chronic obstructive pulmonary disease, using home oxygen. X-ray was noncontributory. ABG was within  range for her conditions. She remained stable and continued on baseline medication.  9.  Gastroesophageal reflux disease: Continued omeprazole.  10. Depression: Continued on Celexa.  11.  Chronic pain syndrome: Continued on oxycodone while she was in the hospital.   IMPORTANT LAB RESULTS IN THE HOSPITAL: On presentation creatinine was 1.78, which came down to 1.56, and further followup 1.19 potassium level was normal, between  3.9 and 4.4. Alkaline phosphatase was mildly high, 218. Ammonia level was 65; went up to  75, but the patient remained totally alert and oriented so we did not further changes the treatment.   Hemoglobin 10.1, platelet count 75 (chronically low), INR 1.3.   Urinalysis was positive, with 15 WBCs and 1+ leukocyte esterase, 3+ bacteria.   Urine culture grew more than 100,000 CFU E. coli, which were resistant to levofloxacin but sensitive to cefazolin.   Chest x-ray, PA and lateral: No acute cardiopulmonary disease.   Echocardiogram: Posterior wall thickness.  Left ventricular ejection fraction by visual estimate 70%. Left atrium mildly dilated, mild mitral valve regurgitation, aortic wall normal.   Total time spent on this discharge: Forty-five minutes.    ____________________________ Hope Pigeon Elisabeth Pigeon, MD vgv:dm D: 08/09/2012 10:30:46 ET T: 08/09/2012 10:48:37 ET JOB#: [id number]  cc: Hope Pigeon. Elisabeth Pigeon, MD, <Dictator> Dennison Mascot, MD Altamese Dilling MD ELECTRONICALLY SIGNED 08/17/2012 6:17

## 2014-07-26 NOTE — Discharge Summary (Signed)
PATIENT NAME:  Nancy [name], Kaylen E MR#:  [id number] DATE OF BIRTH:  Apr 19, 1945  DATE OF ADMISSION:  08/14/2012 DATE OF DISCHARGE:   08/17/2012  For a detailed note, please check the history and physical done on admission by Dr. Juliene PinaMody.   DIAGNOSES AT DISCHARGE: As follows:  1.  Generalized weakness due to acute renal failure. 2.  Hypertension. 3.  Hyperlipidemia.  4.  Chronic liver disease. 5.  Anemia.  6.  Thrombocytopenia, chronic in nature.  7.  Chronic pain syndrome.  8.  Diabetes.  9.  Diabetic neuropathy.   The patient is being discharged on an American Diabetic Association low-sodium, low-fat diet.   ACTIVITY:  As tolerated.  Follow up with Dr. Dennison MascotMorrisey Lemont in the next 1 to 2 weeks.  DISCHARGE MEDICATIONS:  As follows: 1.  Glimepiride 4 mg daily. 2.  Symbicort 80/4.5 two puffs b.i.d. 3.  Synthroid 100 mcg daily. 4.  Albuterol inhaler 2 puffs q. 6 hours as needed. 5.  Flexeril 5 mg b.i.d. 6.  Celexa 10 mg 3 tabs daily. 7.  Oxycodone 10 mg q. 8 hours as needed for pain. 8.  Zinc gluconate 50 mg daily. 9.  Nexium 40 mg b.i.d. 10.  Gabapentin 300 mg once in the morning, 1 cap midafternoon and 2 capsules at bedtime. 11.  Neomycin 5 mg 2 tabs b.i.d. 12.  Lantus 48 units at bedtime. 13.  Spiriva 1 puff daily. 14.  Amlodipine 10 mg daily.   CONSULTANTS DURING THE HOSPITAL COURSE:  Dr. Brandy HaleUzma Faheem from psychiatry.   PERTINENT STUDIES DONE DURING THE HOSPITAL COURSE:  BUN and creatinine on admission of 2.5 and the BUN and creatinine on discharge at 1.7.   HOSPITAL COURSE:  This is a 69 year old female with medical problems as mentioned above, presented to the hospital due to generalized weakness and noted to be in acute renal failure.   PROBLEM: 1.  Acute renal failure.  This is likely the cause of the patient's generalized weakness. The patient was hydrated with IV fluids and her Lasix, her HCTZ and ACE inhibitor were held. Her BUN and creatinine has significantly improved  since admission. Her creatinine is now down to 1.7. Her baseline creatinine a few years back, was as low as 0.9. At this point, I am not discharging on any diuretics or ACE inhibitors and I am referring her to see Dr. Wynelle LinkKolluru from nephrology as an outpatient. Her clinical symptoms have significantly improved. She was ambulated by physical therapy and they recommended home health and therefore home health physical therapy is being arranged for her.  2.  Generalized weakness.  This is secondary to the acute renal failure and also deconditioning from her chronic liver disease. The patient was seen by physical therapy and they recommended home health physical therapy upon discharge which is being arranged for her.  3.  Diabetes. The patient's sugars remain stable on Lantus and glimepiride which she will resume upon discharge.  4.  Chronic obstructive pulmonary disease.  No evidence of acute chronic obstructive pulmonary disease exacerbation. She will continue her Spiriva and Symbicort. 5.  Chronic liver disease. The patient did not show any evidence of acute hepatic encephalopathy. She will continue her lactulose and neomycin.  6.  Hypertension. The patient apparently prior to coming in was on Cardizem, benazepril HCTZ and Lasix. Her heart rate has been on the low side around the 50s to 60s; therefore, I have taken her off the Cardizem and due to renal failure I have held  her diuretics and ACE inhibitor. She was started on Norvasc here in the hospital and has been fairly stable on that; therefore, I am discharging her on that at this time. Further titrations to her antihypertensives and medications can be done as an outpatient. 7.  Depression. The patient was maintained on her Celexa. She will resume that. She was seen by psychiatry by Dr. Garnetta Buddy and started on BuSpar and she will continue followup with psyche as an outpatient if needed. 8.  Hyperkalemia. This was likely secondary to acute renal failure. She  received some Kayexalate.  Her K is now normalized now.  THE PATIENT IS A FULL CODE.   She is being discharged home with home health physical therapy services.  TIME SPENT ON DISCHARGE:  40 minutes    ____________________________ Rolly Pancake. Cherlynn Kaiser, MD vjs:ce D: 08/17/2012 15:10:26 ET T: 08/17/2012 16:28:19 ET JOB#: [id number]  cc: Rolly Pancake. Cherlynn Kaiser, MD, <Dictator> Dennison Mascot, MD [place] Siren MD ELECTRONICALLY SIGNED 08/22/2012 20:03

## 2014-07-26 NOTE — H&P (Signed)
PATIENT NAME:  Nancy [name], Nancy [name] MR#:  [id number] DATE OF BIRTH:  11-30-1945  DATE OF ADMISSION:  08/14/2012  PRIMARY CARE PHYSICIAN:  Dr. Ashok Norris   CHIEF COMPLAINT:  Wheezing, poor ambulation.   HISTORY OF PRESENT ILLNESS: This is a very pleasant, 69 year old female who was discharged on May 7 with acute renal failure, bradycardia, discharged to a nursing home, who presents today with weakness and found to have again acute renal failure. When she was discharged, her creatinine was normal. She went to rehab. She was not discharged, per the discharge summary, on any nephrotoxic agents. However, today she comes in with a creatinine of 2.59. She endorses decreased p.o. intake and depression. She also has bilateral lower extremity edema, and says she did take one dose of Lasix today.    REVIEW OF SYSTEMS:  CONSTITUTIONAL: No fever. Positive fatigue, weakness. No weight loss or weight gain. EYES:  No blurry or double vision. EARS, NOSE, THROAT:  No ear pain, hearing loss, seasonal allergies, postnasal drip, snoring.  RESPIRATORY:  No cough, wheezing, hemoptysis. Positive history of COPD on oxygen.  CARDIOVASCULAR:  No chest pain, palpitations, orthopnea. Positive lower extremity edema. No syncope, arrhythmia, dyspnea on exertion.  GASTROINTESTINAL:  No nausea, vomiting, diarrhea, abdominal pain, melena, ulcers.  GENITOURINARY:  No dysuria. She has decreased urine output. No frequency, urgency.  ENDOCRINE: No nocturia, increased sweating, heat or cold intolerance. HEMATOLOGIC/LYMPHATIC:  She has anemia of chronic disease, and does bruise easily.  SKIN:  No rashes or lesions.  MUSCULOSKELETAL:  She has limited activity due to her medical problems.  NEUROLOGIC:  No history of CVA or TIA.   PSYCHIATRIC:  She does endorse depression.   PAST MEDICAL HISTORY:   1.  Patient was admitted 08/06/2012 through 08/09/2012 with acute renal failure, thought to be secondary to dehydration, as well as  bradycardia, thought also to be due to medications.  2.   Liver cirrhosis secondary to over-the-counter medications and Tylenol.   3.  GERD. 4.  Hypothyroidism.  5.  Diabetes.  6.  Hyperlipidemia.  7.  Hypertension.  8.  COPD, on 2 liters as needed.  9.  Remote history of atrial fibrillation.  10.  Anemia of chronic disease, with hemoglobin baseline of 9.  11.  Chronic thrombocytopenia secondary to liver cirrhosis.   PAST SURGICAL HISTORY: 1.  Hysterectomy.  2.  Cholecystectomy. 3.  Cataracts.  ALLERGIES:  ASPIRIN and TYLENOL.   SOCIAL HISTORY:  The patient lives in a senior apartment complex. No alcohol, smoking or IV drug use.   FAMILY HISTORY:  Positive for hypertension and Parkinson's disease.   MEDICATIONS: The patient was discharged with the following medications on May 7  1.  Glimepiride 4 mg daily.  2.  Citalopram 20 mg daily.  3.  Symbicort 2 puffs b.i.d.  4.  Cyclobenzaprine 10 mg t.i.d.  5.  Gabapentin 30 mg 1 tablet in the morning and evening, and 2 tablets at bedtime.  6.  Lantus 20 units at bedtime.  7.  Zofran 4 mg q. 8 hours p.r.n. vomiting.  8.  Synthroid 100 mcg daily. 9.  ProAir HFA 2 puffs q. 6 hours p.r.n.  10.  Spiriva 18 mcg daily.  11.  Ambien 5 mg at bedtime.  12.  Neomycin 500 mg 2 tablets q. 8 hours.  13.  Oxycodone 10 mg b.i.d. p.r.n.  14.  Amlodipine 10 mg daily.  15.  Lactulose 30 mL b.i.d.  16.  O2 p.r.n. at night.  PHYSICAL EXAMINATION: VITAL SIGNS:  Temperature is 98.4, pulse is 48 to 60, respirations 18, blood pressure 109/49, 94% on 2 liters.  GENERAL: The patient is alert, oriented x 3. Does not appear to be in any acute distress.  HEENT: Head is atraumatic. Pupils are round, reactive. Sclerae anicteric. Mucous membranes are dry. Oropharynx is clear. NECK:  Supple, without JVD or carotid bruits. No enlarged thyroid.  CARDIOVASCULAR:  Regular rate and rhythm. No murmur, gallops, or rubs. Cardiac palpation within normal limits.  LUNGS:   Clear to auscultation bilaterally without crackles, rales, rhonchi or wheezing.  Chest:  With symmetrical rise and fall.  EXTREMITIES: The patient has 2+ pitting edema bilaterally and symmetrically.  MUSCULOSKELETAL: Gait is not tested. She is able to move all extremities x 4. Strength and tone are equal bilaterally, and stable.  NEUROLOGIC:  Cranial nerves II through XII are intact. There are no focal deficits. Finger-to-nose is intact. No cerebellar abnormalities. Babinski sign is downgoing.  SKIN:  No jaundice. No rashes or lesions are noted.   LABORATORY:  Sodium 137, potassium 5.2, chloride 111, bicarb 19, BUN 33, creatinine 2.59, glucose is 110. Ammonia level is 57, calcium 8.0, total protein 7.9, albumin 2.4, bilirubin 0.5, alk phos 209, AST 55, ALT 23. Troponin less than 0.02. White blood cells 5.9, hemoglobin 9.2, hematocrit 28.2, platelets are 84.   EKG looks like slow atrial fibrillation. No discernible P waves. No ST elevations or depressions.   ASSESSMENT AND PLAN:  A 69 year old female who was just discharged from the hospital with acute renal failure secondary to dehydration, who presents again with acute renal failure thought to be secondary to dehydration with poor p.o. intake.   1.  Acute renal failure. Again, I think the etiology is likely dehydration, poor p.o. intake. Patient endorses depression and poor p.o. intake. Also with decreased urine output. The challenging part is that she does have lower extremity edema, which could be secondary to her low albumin state. We will try IV fluids, examining her lower extremities daily to monitor response to IV fluids and kidney function. She was not discharged with any nephrotoxic agents. We will not provide any nephrotoxic agents here. Repeat a BMP in the morning.  2.  Depression. The patient does endorse depression. It does appear that she is on Celexa. She denies any suicidal or homicidal ideations. I think this would be a good time for  her to have a Psychiatry consult, which she does consent to. If her depression is playing a role in her poor p.o. intake, this will decrease the amount of hospitalizations for acute renal failure that the patient will come in with.  Psychiatry consult has been placed. Will continue Celexa for now.   3.  Liver cirrhosis, with chronic anemia and thrombocytopenia. The patient will continue with her lactulose and neomycin. Her ammonia level is 57. She does not have any confusion at this time.   4.  Hyperkalemia. Potassium is 5.2. We will provide Kayexalate, repeat a potassium in the morning.   5.  Diabetes. The patient will be placed on sliding scale insulin. Continue her Lantus. Due to her acute renal failure, will hold the glipizide.   6.   Elevated liver function tests secondary to liver cirrhosis, which is stable.   7.  The patient is a FULL CODE STATUS.  Time spent:  Approximately 45 minutes.    ____________________________ Donell Beers. Benjie Karvonen, MD spm:mr D: 08/14/2012 17:02:03 ET T: 08/14/2012 19:53:37 ET JOB#: [id number]  cc:  Trevious Rampey P. Benjie Karvonen, MD, <Dictator> Ashok Norris, MD  Trystan Akhtar P Shelvia Fojtik MD ELECTRONICALLY SIGNED 08/14/2012 21:26

## 2014-07-26 NOTE — Consult Note (Signed)
Chief Complaint:  Subjective/Chief Complaint Pt c/o fatigue.  Scant epistaxis this AM. 3-4 Loose BMS daily with lactulose.  Denies abdominal pain, nausea or vomiting.   VITAL SIGNS/ANCILLARY NOTES: **Vital Signs.:   22-May-14 11:38  Vital Signs Type Routine  Temperature Temperature (F) 98.5  Celsius 36.9  Temperature Source oral  Pulse Pulse 63  Respirations Respirations 18  Systolic BP Systolic BP 703  Diastolic BP (mmHg) Diastolic BP (mmHg) 53  Mean BP 73  Pulse Ox % Pulse Ox % 98  Pulse Ox Activity Level  At rest  Oxygen Delivery Room Air/ 21 %   Brief Assessment:  Cardiac Regular  + LE edema   Respiratory normal resp effort   Gastrointestinal Normal   Gastrointestinal details normal Soft  Bowel sounds normal  No rebound tenderness  No gaurding  mod distention   Additional Physical Exam GEN: A/Ox3. NAD. Skin: Pink, warm & dry Ext: No asterixis   Lab Results: Routine Chem:  22-May-14 04:02   Glucose, Serum  209  BUN  28  Creatinine (comp)  1.52  Sodium, Serum 141  Potassium, Serum 4.3  Chloride, Serum 107  CO2, Serum 27  Calcium (Total), Serum 8.7  Anion Gap 7  Osmolality (calc) 293  eGFR (African American)  40  eGFR (Non-African American)  35 (eGFR values <81m/min/1.73 m2 may be an indication of chronic kidney disease (CKD). Calculated eGFR is useful in patients with stable renal function. The eGFR calculation will not be reliable in acutely ill patients when serum creatinine is changing rapidly. It is not useful in  patients on dialysis. The eGFR calculation may not be applicable to patients at the low and high extremes of body sizes, pregnant women, and vegetarians.)  Routine Hem:  22-May-14 04:02   WBC (CBC)  2.9  RBC (CBC)  3.35  Hemoglobin (CBC)  9.8  Hematocrit (CBC)  29.1  Platelet Count (CBC)  73  MCV 87  MCH 29.4  MCHC 33.8  RDW  16.8  Neutrophil % 64.7  Lymphocyte % 19.0  Monocyte % 9.8  Eosinophil % 5.2  Basophil % 1.3  Neutrophil  # 1.9  Lymphocyte #  0.5  Monocyte # 0.3  Eosinophil # 0.1  Basophil # 0.0 (Result(s) reported on 24 Aug 2012 at 05:09AM.)   Assessment/Plan:  Assessment/Plan:  Assessment Dysphagia:  Normal BPE.  No indication for EGD or esophageal dilation. 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  Thanks for consult.  Call if any questions or problems.   Electronic Signatures: JAndria Meuse(NP)  (Signed 22-May-14 12:34)  Authored: Chief Complaint, VITAL SIGNS/ANCILLARY NOTES, Brief Assessment, Lab Results, Assessment/Plan   Last Updated: 22-May-14 12:34 by JAndria Meuse(NP)

## 2014-07-26 NOTE — Discharge Summary (Signed)
PATIENT NAME:  Nancy [name], Nancy [name] MR#:  [id number] DATE OF BIRTH:  [birth year]  DATE OF ADMISSION:  02/06/2013 DATE OF DISCHARGE:  02/08/2013  ADMISSION DIAGNOSIS: Hepatic encephalopathy.   DISCHARGE DIAGNOSIS:  1.  Hepatic encephalopathy.  2.  History of liver cirrhosis.  3.  Acute renal failure, stage III chronic renal failure.  4.  Liver diabetes.  5.  Anemia of chronic disease.  6.  Abnormal CT.  CONSULTATIONS: None.   LABORATORY AND DIAGNOSTICS: Discharge sodium 137, potassium 4.7, chloride 106, bicarb 23, BUN 35, creatinine 1.7, glucose 162, ammonia 78.   CT of the head on admission showed a round lesion in the deep white matter/basal ganglia on the right.   HOSPITAL COURSE: This is a 69 year old female with known liver cirrhosis who presented with hepatic encephalopathy. For further details, please refer to the H and P.  1.  Hepatic encephalopathy. The patient was restarted on her lactulose and rifaximin. Her mental status actually had improved prior to admission, however, she was admitted because she had abnormal CT scan and the plan was to get a MRI. Her mental status continues to be at baseline. 2.  Abnormal CT scan. She has gone down twice for a MRI, but she unable to perform MRI completely. We have given her Dilaudid and Ativan, but due to pain and unable to lie flat for that period of time she was unable to get the MRI. She did not want to try to get the MRI for the third time. We will defer this to her outpatient doctor.  3.  Acute renal failure. Baseline is 1.5 to 1.6. The creatinine is improving. I suspect this is prerenal.  4.  Liver cirrhosis. The patient will continue on Lasix and Aldactone.  5.  Diabetes. The patient will continue her outpatient medications.  6.  Anemia of chronic disease. Her hemoglobin remained stable. She has a baseline of about 7 to 8.   DISCHARGE MEDICATIONS: 1.  Symbicort 2 puffs b.i.d.  2.  ProAir 2 puffs q. 6 hours p.r.n.  3.  Zinc gluconate 50 mg  daily.  4.  Nexium 40 mg b.i.d.  5.  Gabapentin 300 mg 1 tablet in the morning, 1 at lunch and 2 at night.  6.  Remeron 30 mg daily.  7.  NovoLog sliding scale.  8.  Sarna to affected area daily.  9.  Triamcinolone topical 0.1% b.i.d.  10.  Lactulose 30 mL 4 times a day.  11.  Synthroid 125 mcg daily.  12.  Cetirizine 10 mg daily.  13.  Lantus 10 units daily.  14.  Spironolactone 50 mg daily.  15.  Nystatin powder to groin and under arms b.i.d.  16.  Spiriva 18 mcg daily.  17.  Xifaxan 550 mg b.i.d. 18.  Citalopram 20 mg daily.  19.  Lasix 40 mg daily.  20.  Zofran 1/2 tablet q. 6 hours p.r.n.  21.  Tramadol 50 mg q. 4 hours p.r.n.  22.  Anusol daily as needed.  23.  Hydroxyzine hydrochloride 25 mg q. 4 hours p.r.n.  24.  Ativan 0.5 mg q. 8 hours. 25.  Oxycodone 10 mg q. 6 hours p.r.n. pain.  26.  Albuterol inhaler 4 times a day as needed for shortness of breath.   DISCHARGE HOME HEALTH: With physical therapy, nurse and nurse aide.   DISCHARGE DIET: Low sodium.   DISCHARGE ACTIVITY: As tolerated.   DISCHARGE FOLLOWUP: The patient will follow up with Nancy MascotLemont [name] in 1 week  for repeat BMP. The patient is medically stable for discharge.   TIME SPENT: 35 minutes.  ____________________________ Nancy [name]. Nancy Pina, MD spm:sb D: 02/08/2013 14:27:25 ET T: 02/08/2013 15:38:26 ET JOB#: [id number]  cc: Nancy [name] P. Nancy Pina, MD, <Dictator> Nancy Mascot, MD Nancy [name] [name] MD ELECTRONICALLY SIGNED 02/08/2013 20:31

## 2014-07-26 NOTE — H&P (Signed)
PATIENT NAME:  Nancy [name] MR#:  [id number] DATE OF BIRTH:  [birth year]  DATE OF ADMISSION:  02/20/2013  PRIMARY CARE PHYSICIAN: Dennison Mascot, MD  CHIEF COMPLAINT: Left hip pain, status post fall today.  HISTORY OF PRESENT ILLNESS: Ms. Nancy [name] is a pleasant 69 year old Caucasian female well-known to our service from previous many admissions. Has chronic cirrhosis of liver with chronic ascites and history of coagulopathy with history of recurrent hepatic encephalopathy, comes into the Emergency Room after she accidentally slid off her bed this morning while she was trying to get up around 3:00 to use the bathroom to have a bowel movement. She takes lactulose on a daily basis and ended up having a bowel movement after she fell down on the floor. Since then she has been complaining of significant left hip pain, unable to ambulate. Hence brought to the Emergency Room where she was found to have on CT of the hip left femur osteonecrosis, possible subchondral femur collapse/fracture. The patient has known history of osteonecrosis of the left femur. She is being admitted for pain control. She did not have any other recent illnesses, other than admitted for hepatic encephalopathy in the first part of November.   PAST MEDICAL HISTORY:  1.  Hepatic encephalopathy.  2.  COPD. 3.  Chronic liver disease with cirrhosis of liver due to multiple over-the-counter meds and herbal medications causing liver disease.  4.  Chronic low back pain.  5.  Diabetes.  6.  Depression.  7.  CKD, baseline 1.5.  8.  Hypertension.   PAST SURGICAL HISTORY: 1.  Cataract surgery. 2.  Liver biopsy.   ALLERGIES: ASPIRIN AND TYLENOL due to liver disease.   SOCIAL HISTORY: Lives with daughter. In the summertime was in rehab.   FAMILY HISTORY: Father was healthy, died in the 82s due to MVA. Mother had hypertension and Parkinson's.   MEDICATIONS: 1.  Zinc gluconate 50 mg p.o. daily.  2.  Xifaxan 550 mg 1 tablet b.i.d.   3.  Triamcinolone topical apply to affected area b.i.d.  4.  Tramadol 50 mg every 4 hours.  5.  Symbicort 80/4.5 two puffs b.i.d.  6.  Spironolactone 50 mg p.o. daily.  7.  Spiriva 18 mcg inhalation daily.  8.  Sarna 0.5% topical apply to affected area.  9.  ProAir HFA 90 mcg inhalation 2 puffs every 6 hours.  10.  Oxycodone 10 mg every 6 hours.  11.  Zofran 4 mg 1/2 tablet 2 mg every 6 hours as needed.  12.  Nystatin apply to affected area as needed.  13.  NovoLog sliding scale.  14.  Nexium 40 mg b.i.d.  15.  Remeron 30 mg daily at bedtime.  16.  Lorazepam 0.5 mg every 8 hours as needed.  17.  Levothyroxine 125 mcg p.o. daily.  18.  Lantus 10 units sub-Q daily. 19.  Lactulose 30 mL 4 times a day.  20.  Hydroxyzine hydrochloride 25 mg 1 tablet 4 times a day as needed for itching.  21.  Gabapentin 300 mg 1 capsule in the morning, 1 at lunch, and 2 at bedtime.  22.  Lasix 40 mg daily.  23.  Citalopram 20 mg daily.  24.  Cetirizine 10 mg p.o. daily.  25.  Anusol-HC 25 mg rectal suppository daily.  26.  Albuterol ipratropium nebulizer 4 times a day as needed.   REVIEW OF SYSTEMS: CONSTITUTIONAL: No fever. Positive for fatigue, weakness, and left hip pain.  EYES: No blurred or double vision.  Positive for cataracts.  ENT: No tinnitus, ear pain, hearing loss, or sinusitis.  RESPIRATORY: No cough, wheeze, hemoptysis, or shortness of breath. Positive for COPD, chronic.  CARDIOVASCULAR: No chest pain, orthopnea. Positive for chronic leg edema. No arrhythmia.  GASTROINTESTINAL: Positive for ascites. No abdominal pain, rectal bleed, hematemesis, nausea or vomiting.  GENITOURINARY: No dysuria, hematuria, or renal calculus.  ENDOCRINE: No polyuria, nocturia, or thyroid problems.  HEMATOLOGY: Positive for chronic anemia. Positive for easy bruising. No bleeding disorder.  MUSCULOSKELETAL: No swelling of the joints. Positive for chronic back pain and arthritis.  NEUROLOGIC: No CVA, TIA. No  tremors. No encephalopathy.  PSYCHIATRIC: No anxiety, depression, or bipolar disorder. All other systems reviewed and negative.   PHYSICAL EXAMINATION: GENERAL: The patient is awake, alert, and oriented x3, not in acute distress.  VITAL SIGNS: Afebrile. Pulse is 78, respirations 18, and blood pressure is 136/69. Pulse ox is 96 on room air.  HEENT: Atraumatic, normocephalic. Pupils are equally round and reactive to light and accommodation. EOMI intact. Oral mucosa is moist.  NECK: Supple. No JVD. No carotid bruit.  LUNGS: Clear to auscultation bilaterally. No rales, rhonchi, respiratory distress, or labored breathing.  HEART: Both the heart sounds are normal. Rate and rhythm regular. PMI not lateralized. Chest nontender.  EXTREMITIES: 2+ pitting edema up to the knee joint. This is chronic dependent edema. Feeble pedal pulses due to edema. Good femoral pulses.  ABDOMEN: Distended due to ascites, chronic. It does not appear to be tense ascites. I cannot appreciate hepatosplenomegaly due to ascites. Bowel sounds cannot be appreciated as well secondary to ascites. NEUROLOGIC: Grossly intact cranial nerves II through XII. No motor or sensory deficit. The patient does have generalized weakness.  SKIN: The patient has significant bruises, different level of healing, and has significant amount of skin tears in both upper extremities.  PSYCHIATRIC: The patient is awake, alert, and oriented x3.   LABORATORY AND DIAGNOSTICS: H and H is 9.6 and 28.7. White count is 4.6. Glucose is 111, BUN is 40, creatinine is 2, sodium is 139, potassium is 4.5, chloride 108, bicarbonate is 25, and calcium is 8.7. PT-INR is 19.1 and 1.6.   CT of the left hip shows osteonecrosis of the left femoral head with subchondral collapse/fracture, new since 11/02/2012.   ASSESSMENT: 69 year old Ms. Nancy [name] with history of chronic cirrhosis of liver with chronic ascites, hyperlipidemia, history of falls, history of gastric ulcers,  diabetes, and chronic obstructive pulmonary disease comes into the Emergency Room after she rolled out of bed and fell down on the floor. She is being admitted with:  1.  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 The patient has known history of left femoral osteonecrosis, evaluated by Dr. Rosita KeaMenz in the past, recommended elective hip surgery at that time. However, given severe cirrhosis the patient was advised against surgery by her [facility] liver MDs. The patient will be admitted at this time on the orthopedic floor. Will continue her oxycodone and tramadol as needed. Orthopedic consultation. The case was discussed by the ER physician with Dr. Claris Gladdenamasunder. We will have physical therapy see the patient. We will have case management/social worker look into discharge planning.  2.  Chronic cirrhosis of liver along with chronic ascites with coagulopathy and history of hepatic encephalopathy. PT and INR are slightly elevated, which is expected given chronic liver disease. She has no active bleeding. Will continue  all her meds, which is mainly lactulose, Xifaxan, and her Lasix along with Aldactone.  3.  Type 2 diabetes. Sliding scale insulin for now.  4.  Chronic obstructive pulmonary disease. Appears stable. Her sats are stable. We will continue her inhalers and SVNs.  5.  Chronic itching. We will continue her hydroxyzine hydrochloride.  6.  Deep vein thrombosis prophylaxis. SCDs.  7.  Gastroesophageal reflux disease. We will continue Nexium.   Further work-up according to the patient's clinical course. Hospital admission plan was discussed with the patient and the patient's daughter. The patient is a FULL CODE.   TIME SPENT: 50 minutes.  ____________________________ Wylie Hail Allena Katz, MD sap:sb D: 02/20/2013 08:22:26 ET T: 02/20/2013 08:52:43 ET JOB#: [id number]  cc: Georgia Delsignore A. Allena Katz, MD,  <Dictator> Dennison Mascot, MD Willow Ora MD ELECTRONICALLY SIGNED 02/26/2013 14:14

## 2014-07-26 NOTE — Discharge Summary (Signed)
PATIENT NAME:  Nancy [name], Nancy [name] MR#:  [id number] DATE OF BIRTH:  Feb 17, 1946  DATE OF ADMISSION:  02/20/2013 DATE OF DISCHARGE:  02/21/2013  PRESENTING COMPLAINT: Fall and hip pain.   DISCHARGE DIAGNOSES: 1.  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 Conservative management.  2.  Chronic cirrhosis of liver along with chronic ascites with coagulopathy and history of hepatic encephalopathy.  3.  Type 2 diabetes.  4.  Chronic obstructive pulmonary disease.  5.  Chronic itching.  6.  Hyperlipidemia.   CODE STATUS: FULL CODE.  CONSULTATION: Ortho, Dr. Claris Gladden.  MEDICATIONS: 1.  Docusate 100 mg b.i.d. p.r.n.  2.  Senokot 1 tablet b.i.d. p.r.n.  3.  Roxicodone 10 mg q. 6 p.r.n.  4.  Tramadol 50 mg q. 4 p.r.n. for mild pain.  5.  Lorazepam 0.5 mg q. 8 hourly p.r.n.  6.  Celexa 20 mg daily.  7.  Zyrtec 10 mg daily.  8.  Remeron 30 mg at bedtime.  9.  Zofran 2 mg p.o. q. 4 to 6 p.r.n.  10.  Albuterol 2 puffs q. 4 while awake.  11.  Atarax 25 mg q. 6 p.r.n.  12.  Spiriva 1 capsule inhalation daily.  13.  Symbicort 80/4.5 two puffs b.i.d.  14.  Lactulose 30 mL q.i.d.  15.  Lasix 40 mg daily.  16.  Lantus 10 units subcutaneous daily.  17.  Zinc gluconate 50 mg with breakfast.  18.  Xifaxan 550 mg p.o. b.i.d.   19.  Synthroid 0.125 mg p.o. daily.  20.  Triamcinolone 0.1% ointment to affected area b.i.d.  21.  Sliding scale insulin.  21.  Protonix 40 mg p.o. b.i.d.  22.  Nystatin apply to affected area t.i.d.  23.  Gabapentin 300 mg b.i.d.  24.  Aldactone 12.5 mg p.o. daily with meals.   LABORATORY AND IMAGING:   1.  X-ray right shoulder: No acute findings. Proliferative changes seen involving the [facility] joint.  2.  White count is 4.6, hemoglobin and hematocrit is 9.6 and 28.7, platelet count is clumping noted on peripheral smear.    3.  Glucose is 111, BUN is 40, creatinine is 2.01.  4.  PT and  INR is 19.1 and 1.6.  5.  CT of the left hip shows osteonecrosis of the left femoral head with subchondral fracture collapse.   BRIEF SUMMARY OF HISTORY OF PRESENT ILLNESS: Nancy [name] is a 69 year old female with history of chronic cirrhosis of liver, chronic ascites and history of COPD, comes into the Emergency Room with mechanical fall at home when she slid down off her bed while trying to get up in the early hours of the morning to use the bathroom.  She was found to have:  1.  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 This is new on the CT scan since 11/02/2012. The patient has known history of left femoral osteonecrosis. She was evaluated by Dr. Rosita Kea in the past who  recommended elective hip surgery at that time. However, given her cirrhosis the patient was advised against surgery by her [facility] liver MDs.  She was admitted on the orthopedic floor. This time seen by Dr. Claris Gladden who recommended weight-bearing as tolerated by PT who evaluated the patient and recommends rehab for which the patient is going to be discharged today. Ortho also recommends if the patient's pain is out of  controlled than we need to re-evaluate for possible hip replacement 2.  Chronic cirrhosis of liver along with chronic ascites and coagulopathy and history of hepatic encephalopathy. PT/INR slightly elevated which is expected given chronic liver disease. She has no active bleeding. We will continue her medications, which is lactulose, Xifaxan, Lasix and Aldactone.  3.  Type 2 diabetes. Lantus and sliding scale were resumed.  4.  Chronic obstructive pulmonary disease. Appears to be stable. The patient continued on oxygen. We will continue her inhalers and SVNs.  5.  Chronic itching.  The patient has significant dry skin. I advised her to apply hydrating lotion/cream and we will continue her Hydroxyzine hydrochloride.  6.  GERD.  Continue Nexium.    Hospital stay otherwise remained stable. The patient remained FULL CODE.   TIME SPENT: 40 minutes.     ____________________________ Wylie HailSona A. Allena KatzPatel, MD sap:dp D: 02/21/2013 12:51:29 ET T: 02/21/2013 13:58:00 ET JOB#: [id number]  cc: Rushi Chasen A. Allena KatzPatel, MD, <Dictator> Murlean HarkShalini Ramasunder, MD Dennison MascotLemont Morrisey, MD Leitha SchullerMichael J. Menz, MD  Willow OraSONA A Estefano Victory MD ELECTRONICALLY SIGNED 02/26/2013 14:15

## 2014-07-26 NOTE — Discharge Summary (Signed)
PATIENT NAME:  Nancy [name], Nancy [name] MR#:  [id number] DATE OF BIRTH:  1945-06-27  DATE OF ADMISSION:  08/20/2012 DATE OF DISCHARGE:  08/26/2012  For a detailed note, please take a look at the history and physical done on admission by Dr. Judithann SheenSparks.   DIAGNOSES AT DISCHARGE:  Is as follows:  1.  Acute on chronic congestive heart failure secondary to diastolic dysfunction.  2.  History of chronic kidney disease, stage 3.  3.  Pancytopenia.  4.  History of chronic liver disease.  5.  Diabetes.  6.  Diabetic neuropathy.  7.  Hypothyroidism. 8.  Anxiety/depression.  9.  Chronic back pain.   DIET: The patient is being discharged on [employer], low sodium, low fat diet.   ACTIVITY:  As tolerated.  The patient is being discharged home with home health, physical therapy and nursing services.   FOLLOW-UP:  Is with Dr. Dennison MascotLemont Morrisey in the next 1 to 2 weeks.    DISCHARGE MEDICATIONS:  Glimepiride 4 mg daily, Symbicort 2 puffs twice daily 80/4.5, Synthroid 100 mcg daily, albuterol inhaler 2 puffs q. 6 hours as needed, Flexeril 5 mg twice daily, Celexa 10 mg 3 tabs daily, oxycodone 10 mg q. 8 hours as needed, zinc gluconate 50 mg daily, Nexium 40 mg twice daily, gabapentin 300 mg in the morning, one cap midafternoon and two caps at bedtime, neomycin 500 mg 2 tabs twice daily, Lantus 48 units at bedtime, Spiriva 1 puff daily, amlodipine 10 mg daily, fentanyl patch 25 mcg q. 72 hours and Lasix 40 mg daily.   CONSULTANTS DURING THE HOSPITAL COURSE:  Dr. Mosetta PigeonHarmeet Singh from nephrology.   PERTINENT STUDIES DONE DURING THE HOSPITAL COURSE:  Are as follows:  An x-ray done on 08/20/2012 showing findings reflecting mild congestive heart failure and interstitial edema.  An x-ray of the thoracic spine done on May 20th showing partial compression of the body of T6 which is new since July of 2013.  A barium swallow done on May 21st showing thoracic esophagus and gastroesophageal junction to be normal in  caliber.  A 2-dimensional echocardiogram done on May 19th showing normal LV function, EF of 55% to 60%, normal global LV systolic function, mild mitral valve regurgitation, mild elevated pulmonary artery pressure, mild tricuspid regurgitation.   HOSPITAL COURSE:  This is a 69 year old female with medical problems as mentioned above, presented to the hospital on 08/20/2012 secondary to shortness of breath and altered mental status and also noted to be hypoglycemic.  1.  Acute on chronic congestive heart failure.  This was likely the cause of patient's shortness of breath and weakness on admission.  The patient was noted to have an elevated BNP and also noted to have chest x-ray findings suggestive of pulmonary edema with worsening lower extremity edema.  The patient was started on gentle IV diuresis given her chronic kidney disease.  Her clinical symptoms of shortness of breath have significantly improved with diuresis.  Her creatinine has remained stable.  She is about 9 to 10 liters negative since admission and clinically feels much better and therefore is being discharged home.  She will also continue her maintenance meds including Lasix upon discharge.  2.  Hypoglycemia with diabetes.  The patient only had one episode of hypoglycemia prior to coming in the hospital.  Her antidiabetic medications were held including the Lantus and also her Amaryl, although they have since been resumed and she has had no further episodes of hypoglycemia.  She is eating much  better.  She will continue her Amaryl and Lantus as mentioned on the discharge medications.  3.  Chronic kidney disease, stage 3.  Since she was being diuresed with Lasix her kidney function was closely followed.  Nephrology consult was obtained.  Despite having a good diuresis her renal function has remained stable.  She will continue follow-up with nephrology as an outpatient for now.  4.  Pancytopenia.  This apparently is chronic for the patient.  She  has seen Dr. Orlie Dakin in the past.  She did not require any transfusion while in the hospital.  She did not show any evidence of acute bleeding.  She will continue follow-up at the [facility] with Dr. Orlie Dakin regarding her pancytopenia.  The cause of her pancytopenia is thought to be related to her chronic liver disease.  5.  History of chronic liver disease.  Again, the cause of her chronic liver disease is unclear, but it is probably related to over-the-counter meds as the patient mentions.  She did not have any evidence of acute hepatic encephalopathy.  She will continue her neomycin and lactulose as mentioned upon discharge.  She was maintained on those while in the hospital.  6.  Chronic obstructive pulmonary disease.  The patient had no evidence of acute chronic obstructive pulmonary disease exacerbation.  She will continue her Symbicort and Spiriva as stated.  7.  Hypertension.  The patient remained hemodynamically stable in the hospital.  She will continue on Norvasc upon discharge.  8.  Chronic back pain.  This is secondary to a mild compression fracture as seen on the thoracic spine x-ray.  The patient was given some IV doses of morphine while in the hospital and also continued her on oxycodone.  For now she is being discharged on a fentanyl patch and oxycodone as stated.  9.  Hypothyroidism.  The patient was maintained on her Synthroid.  She will resume that.  10.  GERD.  The patient was maintained on her Nexium and she will resume that upon discharge too.  11.  History of anxiety/depression.  The patient was maintained on her Celexa and she will resume that upon discharge.   CODE STATUS:  The patient is a FULL CODE.   DISPOSITION:  She is being discharged with home health, nursing and physical therapy services.   TIME SPENT:  40 minutes.    ____________________________ Rolly Pancake. Cherlynn Kaiser, MD vjs:ea D: 08/26/2012 15:24:05 ET T: 08/27/2012 03:40:01 ET JOB#: [id number]  cc: Rolly Pancake.  Cherlynn Kaiser, MD, <Dictator> Dennison Mascot, MD Houston Siren MD ELECTRONICALLY SIGNED 09/10/2012 20:25

## 2014-07-26 NOTE — H&P (Signed)
PATIENT NAME:  Nancy [name], NORBECK MR#:  [id number] DATE OF BIRTH:  08/05/1945  DATE OF ADMISSION:  08/29/2012  PRIMARY CARE PHYSICIAN: Dennison Mascot, MD   CHIEF COMPLAINT: Altered mental status.   HISTORY OF PRESENT ILLNESS: This is a 69 year old female who presents to the hospital due to altered mental status and confusion. The patient was just discharged about 3 days ago. As per the daughter, the patient has been speaking certain things out of her mind and also not following some sense of direction. She gave an example that when the patient was trying to give herself her insulin yesterday, she could not dial up the right dose. She then dialed up the dose less than what it needs to be. The patient also has had some poor p.o. intake. Since her mental status was not improving, she was brought to the ER for further evaluation. The patient in the Emergency Room was noted to be in acute renal failure with a creatinine of 2.8. Her creatinine a few days ago prior to discharge was 1.5. She was also noted to have a mildly elevated ammonia at 46. Presently, the patient appears to be alert and oriented. Given her mental status change and her acute renal failure, hospitalist service was contacted for further treatment and evaluation. The patient presently denies any chest pain, any shortness of breath, any nausea, vomiting, abdominal pain, fevers, chills, cough or any other associated symptoms.   REVIEW OF SYSTEMS:   CONSTITUTIONAL: No documented fever. No weight gain. No weight loss.  EYES: No blurry or double vision.  EARS, NOSE, THROAT: No tinnitus. No postnasal drip. No redness of the oropharynx.  RESPIRATORY: No cough, no wheeze, no hemoptysis, no dyspnea.  CARDIOVASCULAR: No chest pain, no orthopnea, no palpitations, no syncope.  GASTROINTESTINAL: No nausea, no vomiting, no diarrhea, no abdominal pain, no melena, no hematochezia.  GENITOURINARY: No dysuria, no hematuria.  ENDOCRINE: No polyuria or nocturia.  No heat or cold intolerance.  HEMATOLOGIC: Positive chronic anemia. No bruising, no acute bleeding.  INTEGUMENTARY: No rashes, no lesions.  MUSCULOSKELETAL: No arthritis, no swelling, no gout.  NEUROLOGIC: No numbness, no tingling, no ataxia, no seizure type activity.  PSYCHIATRIC: No anxiety, no insomnia, no ADD. Positive depression.   PAST MEDICAL HISTORY: Consistent with chronic liver disease, history of depression, chronic back pain, chronic kidney disease stage III, diabetes, hypertension, COPD.   ALLERGIES: ASPIRIN AND TYLENOL.   SOCIAL HISTORY: No smoking. No alcohol abuse. No illicit drug abuse. Currently resides by herself.   FAMILY HISTORY: Positive for hypertension and Parkinson disease.   CURRENT MEDICATIONS: Amlodipine 10 mg daily, Celexa 10 mg daily, Flexeril 5 mg b.i.d., fentanyl patch 25 mcg q.72 hours, Lasix 80 mg daily; gabapentin 300 mg in the morning, 300 mg in the afternoon and 2 caps at bedtime; glyburide 4 mg daily, Lantus 48 units at bedtime, Synthroid 100 mcg daily, neomycin 500 mg 2 tabs b.i.d. Monday through Friday, Nexium 40 mg b.i.d., oxycodone 10 mg q.8 hours as needed, albuterol inhaler 2 puffs q.6 hours as needed, Spiriva 1 puff daily, Symbicort 80/4.5 two puffs b.i.d., zinc gluconate 1 tab daily.   PHYSICAL EXAMINATION ON ADMISSION:  VITAL SIGNS: Temperature is 98, pulse 72, respirations 20, blood pressure 115/28, sats 94% on room air.  GENERAL: She is a pleasant-appearing female in no apparent distress.  HEENT: Atraumatic, normocephalic. Her extraocular muscles are intact. Her pupils are equal and reactive to light. Her sclerae are anicteric. No conjunctival injection. No pharyngeal erythema.  NECK: Supple. There is no jugular venous distention. No bruits, no lymphadenopathy, no thyromegaly.  HEART: Regular rate and rhythm. No murmurs, no rubs,  no clicks.  LUNGS: Clear to auscultation bilaterally. No rales, no rhonchi, no wheezes.  ABDOMEN: Soft, flat,  nontender, nondistended. Has good bowel sounds. No hepatosplenomegaly appreciated.  EXTREMITIES: She has +1 to 2 pitting edema bilaterally. Positive pedal and radial pulses bilaterally.  NEUROLOGICAL: The patient is alert, awake and oriented x 3 with no focal motor or sensory deficits appreciated bilaterally.  SKIN: Moist, warm, with no rashes appreciated.  LYMPHATIC: There is no cervical or axillary lymphadenopathy.   LABORATORY DATA: Serum glucose 72, BUN 38, creatinine 1.3, sodium 133, potassium 4.1, chloride 100, bicarbonate 24. LFTs are mildly abnormal with albumin of 2.5, alkaline phosphatase of 246, AST of 64. Troponin less than 0.02. White cell count 4.8, hemoglobin 9.6, hematocrit 28.8, platelet count 59. INR is 1.3.   ASSESSMENT AND PLAN: This is a 69 year old female with history of chronic liver disease, history of depression, chronic back pain, chronic kidney disease stage III, diabetes, hypertension, chronic obstructive pulmonary disease, presents to the hospital with altered mental status and also noted to be in acute renal failure.  1.  Acute on chronic renal failure: The likely cause of this is prerenal azotemia and dehydration. The patient apparently was taking 80 mg of Lasix, when she should have only been taking 40 mg. Her creatinine at discharge 3 days ago was 1.5, now up to 2.8. I will gently hydrate her with intravenous fluids, follow BUN and creatinine and urine output, hold her Lasix for now. I will get a nephrology consult.  2.  Altered mental status and confusion: The etiology of this is unclear, but likely multifactorial in nature. Questionable history of depression. Also related to underlying pain medications from her chronic back pain. Also possible underlying hepatic encephalopathy given the elevated ammonia level. For now, I will reduce her fentanyl dose from 25 mg to 12.5 mg. I will also continue her lactulose for her encephalopathy. Follow mental status. She already has an  appointment to see Dr. Garnetta BuddyFaheem as an outpatient from psychiatry.  3.  Diabetes: I will continue her glyburide and Lantus. Continue carb-controlled diet. Follow blood sugars.  4.  Chronic back pain: Continue low-dose fentanyl. Continue oxycodone.  5.  Hypertension: The patient is presently hemodynamically stable. Continue her Norvasc. 6.  Chronic obstructive pulmonary disease: No evidence of an acute chronic obstructive pulmonary disease exacerbation. Continue Symbicort. Continue Spiriva.  7.  Diabetic neuropathy: Continue Neurontin.   CODE STATUS: The patient is a full code.   TIME SPENT ON ADMISSION: 50 minutes.    ____________________________ Rolly PancakeVivek J. Cherlynn KaiserSainani, MD vjs:jm D: 08/29/2012 12:55:19 ET T: 08/29/2012 13:21:27 ET JOB#: [id number]  cc: Rolly PancakeVivek J. Cherlynn KaiserSainani, MD, <Dictator> Houston SirenVIVEK J Honestee Revard MD ELECTRONICALLY SIGNED 09/10/2012 20:32

## 2014-07-26 NOTE — Discharge Summary (Signed)
PATIENT NAME:  Nancy [name], Nancy [name] MR#:  [id number] DATE OF BIRTH:  October 27, 1945  DATE OF ADMISSION:  10/09/2012 DATE OF DISCHARGE:  10/13/2012  PRIMARY CARE PHYSICIAN:  Dr. Thana Ates.   DISCHARGE DIAGNOSES: 1.  Acute on chronic respiratory failure.  2.  Acute chronic obstructive pulmonary disease exacerbation.  3.  Left lower lobe pneumonia.  4.  Hepatic encephalopathy.  5.  Cirrhosis.  6.  Diabetes mellitus.  7.  Depression.  8.  Hypothyroidism.   IMAGING STUDIES:  Include a chest x-ray which showed mild pulmonary edema and left lower lobe infiltrate.   CT scan of the head without contrast showed no acute abnormalities.   ADMITTING HISTORY AND PHYSICAL:  Please see detailed H and P dictated on 10/09/2012.  In brief, a 69 year old female patient with history of cirrhosis on lactulose, COPD, presented to the hospital complaining of generalized weakness, confusion and shortness of breath.  The patient was found to have elevated ammonia in the 120s, admitted to the hospitalist service.   HOSPITAL COURSE: 1.  Hepatic encephalopathy.  The patient was started on lactulose with which her confusion improved well.  By the day of discharge, the patient is alert and oriented x 3.  2.  Acute on chronic respiratory failure secondary to acute COPD exacerbation.  The patient was started on IV antibiotics.  She did have left lower lobe infiltrate for which she has been started on Levaquin and has been transitioned to by mouth Levaquin at the time of discharge.  She is afebrile.  White count is in the normal range.  The patient feels back to baseline being on 2 liters of oxygen which she wears at home.  Has requested to be discharged home and will continue on her oral antibiotics and is being sent home in a fair condition.   Today on examination, the patient does not have any crackles or wheezing on exam, is on 2 liters of oxygen and is stable.   DISCHARGE MEDICATIONS: 1.  Glimepiride 4 mg oral once a day.  2.   Symbicort 2 puffs inhaled 2 times a day.  3.  Levothyroxine 100 mg oral once a day.  4.  ProAir HFA 2 puffs inhaled every 6 hours as needed for shortness of breath, wheezing.  5.  Zinc 1 tablet oral once a day.  6.  Nexium 40 mg oral 2 times a day.  7.  Gabapentin 300 mg oral once a day.  8.  Lasix 40 mg oral once a day.  9.  Citalopram 10 mg 2 tablets oral once a day.  10.  Remeron 30 mg oral once a day.  11.  Spiriva 18 mcg inhaled once a day.  12.  Oxycodone 10 mg oral every 8 hours as needed for pain.  13.  NovoLog sliding scale.  14.  Lorazepam 0.5 mg oral once a day as needed for anxiety.  15.  Levaquin 250 mg oral once a day for five days.  16.  Rifaximin 550 mg oral 2 times a day.  17.  Lactulose 30 mL oral 2 times a day.  18.  Prednisone 60 mg tapered by 10 mg for six days.  19.  DuoNeb 3 mL inhaled 4 times a day as needed.   DISCHARGE INSTRUCTIONS:  Carbohydrate-controlled diet.  Activity as tolerated.  Follow up with primary care physician in a week.  Continue oxygen 2 liters continuously.   Time spent on day of discharge in discharge activity was 32 minutes.  ____________________________ Molinda BailiffSrikar R. Kaliyah Gladman, MD srs:ea D: 10/13/2012 16:06:27 ET T: 10/13/2012 23:21:37 ET JOB#: [id number]  cc: Wardell HeathSrikar R. Elpidio AnisSudini, MD, <Dictator> Dennison MascotLemont Morrisey, MD Wardell HeathSRIKAR [place] Bali Camelia Stelzner MD ELECTRONICALLY SIGNED 10/22/2012 23:39

## 2014-07-26 NOTE — H&P (Signed)
PATIENT NAME:  Nancy [name], Nancy [name] MR#:  [id number] DATE OF BIRTH:  10/01/1945  DATE OF ADMISSION:  10/09/2012  REFERRING PHYSICIAN: Dr. Charlesetta Ivory.   PRIMARY CARE PHYSICIAN: Dr. Ashok Norris.    CHIEF COMPLAINT: Shortness of breath, altered mental status.   HISTORY OF PRESENT ILLNESS: This is a 69 year old female with history of cirrhosis, diabetes mellitus, hypertension, with history of multiple admissions in the past due to hepatic encephalopathy. As well, she has known history of chronic renal insufficiency, chronic pain on multiple pain medication and muscle relaxants. Presents to the Emergency Room, brought by her daughter today because of complaints of shortness of breath and altered mental status. The patient was recently discharged last month from [facility] for a similar presentation. The patient lives by herself, but daughter reports she helps take care of her. Upon discharge, the patient stayed with her daughter for 2 weeks, then was at home for 2 weeks. She went to visit her daughter today, where her daughter reports she noticed her to be more lethargic. As well, she found her to have some shortness of breath. The daughter reports her mother has been compliant with her medication, but reports her mother did not have a bowel movement for the last 3 days and today she had only small bowel movements. Reports her mother did not adjust her dose of lactulose and she kept taking the same dose despite her not having bowel movements. In the ED, the patient was found to have ammonia level of 207, where her baseline runs in the 40s. As well, the patient appears to be having acute on chronic renal insufficiency, with a creatinine being elevated from her baseline, being at 1.99 today from baseline of 1.54. As well, the patient appears to be on multiple sedative medications, including oxycodone, gabapentin, mirtazapine and Flexeril. Daughter reports her mother has been taking these meds for generalized body  aches. The patient is currently lethargic. Cannot give any history, and most of the history was obtained from the daughter. Daughter denies her mom complaints of any chest pain, any nausea, any vomiting, any abdominal pain, fever or chills.   PAST MEDICAL HISTORY:  1. Chronic liver disease.  2. Depression.  3. Chronic back pain.  4. Chronic kidney disease.  5. Diabetes mellitus, insulin dependent.  6. Hypertension.  7. COPD.    ALLERGIES: ASPIRIN.   HOME MEDICATIONS:  1. Lactulose 30 ml 2 times a day.  2. Zinc gluconate 50 mg once a day.  3. Symbicort 80 mcg 2 puffs 2 times a day.  4. Spiriva 80 mcg inhalational daily.  5. ProAir every 6 hours as needed.  6. Oxycodone 10 mg every 8 hours.  7. Nexium 40 mg 2 times a day.  8. Rifaximin 550 mg oral b.i.d.  9. Mirtazapine 30 mg oral daily.  10. Levothyroxine 100 mcg oral daily.  11. Lasix 40 mg oral daily.  12. Glimepiride 4 mg oral daily.  13. Gabapentin 300 mg oral daily.  14. Citalopram 20 mg oral daily.  15. Norvasc 10 mg oral daily.   SOCIAL HISTORY: No history of smoking, alcohol or drug use. Lives by herself and daughter helps take care of her.   FAMILY HISTORY: Significant for hypertension and Parkinson disease.   REVIEW OF SYSTEMS: Could not be obtained from this patient secondary to her lethargy and altered mental status.   PHYSICAL EXAMINATION:  VITAL SIGNS: Temperature 97.7, pulse 77, respiratory rate 14, blood pressure 128/48, saturating 100% on room air.  GENERAL: Obese female, looks comfortable in bed. in no apparent distress, lethargic.  HEENT: Head is atraumatic, normocephalic. Pupils equal, reactive to light. Pink conjunctivae. Anicteric sclerae. Dry oral mucosa.  NECK: Supple. No thyromegaly. No JVD. No carotid bruits.  CHEST: Good air entry bilaterally. No wheezing, rales, rhonchi,  CARDIOVASCULAR: S1, S2 heard. No rubs, murmurs or gallops.  ABDOMEN: Bowel sounds present. Appears to be having ascites.  Nontender.  EXTREMITIES: Edema bilaterally 2+. Pulses +2 bilaterally. No clubbing. No cyanosis.  SKIN: Normal skin turgor. Warm and dry.  PSYCHIATRIC: The patient is lethargic, unable to evaluate.  NEUROLOGIC: Unable to evaluate appropriately secondary to her altered mental status but able to be moving all extremities without any gross deficits.  LYMPHATICS: No cervical or supraclavicular lymphadenopathy.   PERTINENT LABS: Glucose 190. BNP 991. BUN 47, creatinine 1.99, sodium 138, potassium 4, chloride 103, CO2 25. Ammonia 207, ALT 32, AST 61, alk phos 289. Troponin less than 0.02. White blood cells 3.5, hemoglobin 8.1, hematocrit 25.7, platelets 65.   ASSESSMENT AND PLAN: This is a 69 year old female with multiple admissions in the past due to hepatic encephalopathy. Presents today with altered mental status and shortness of breath. 1. Altered mental status: This appears to be mainly due to hepatic encephalopathy. As well, the patient is on multiple sedative medications, so will hold all of her sedative medications. Will insert nasogastric tube and start on lactulose.  2. Hepatic encephalopathy: As per daughter, reports her mother has been compliant with medication, but she has not been adjusting her dose according to appropriate bowel movements, so I spoke to the daughter about need for increase in the dosage. The patient does not have a daily bowel movement. Will insert nasogastric tube. Will start her on lactulose currently larger dose and will decrease once she starts having bowel movement. Given the fact the patient did not have any bowel movement for the last few days, will obtain KUB.  3. Diabetes mellitus: Currently, the patient is n.p.o., so will have her on insulin sliding scale every 6 hours and will have to decrease her Lantus dose to 20 units at bedtime.  4. Hypertension: Will hold medications until she is more stable.  5. Chronic pain: Will hold all of her meds secondary to altered mental  status now.  6. Elevated liver function tests: This is secondary to her liver cirrhosis. Appears to be chronically elevated at baseline.  7. Thrombocytopenia secondary to her liver disease: Appears to be at her baseline. Will avoid chemical anticoagulation.  8. Anemia of chronic disease: Will monitor closely, and will transfuse if needed.  9. Gastrointestinal prophylaxis: On intravenous Protonix.  10. Deep vein thrombosis prophylaxis: On sequential compression device.   TOTAL TIME SPENT ON ADMISSION AND PATIENT CARE: 55 minutes.   CODE STATUS: The patient is FULL CODE. Daughter reports she is her power of attorney and reports the patient does not have a Living Will.    ____________________________ Albertine Patricia, MD dse:gb D: 10/09/2012 03:25:18 ET T: 10/09/2012 05:23:43 ET JOB#: [id number]  cc: Albertine Patricia, MD, <Dictator> Jerrilyn Messinger Graciela Husbands MD ELECTRONICALLY SIGNED 10/09/2012 6:46

## 2014-07-26 NOTE — Consult Note (Signed)
Psychiatry: Consult for this 69 year old woman currently in the hospital for renal insufficiency as well as chronic liver disease and heart disease. Consult for depression.  Information obtained from the patient and from the chart. Patient reports that her mood has been depressed for about the last 2 months. Perhaps longer. She feels sad much of the time. Has frequent crying spells. Feels a lack of energy. She is less interested in normal social activities. She has more difficulty falling asleep at night. She feels negative about herself. She admits to having occasional thoughts of wishing that she were dead but denies any actual suicidal intent or plan. Denies any psychotic symptoms. She has been compliant with her medicine and denies substance abuse. Patient blames a significant amount of her depression on her medical illness. She feels very frustrated at the current medical illness she is having and how it prevents her from being independent. She also feels like people do not want to be around her when she is sick. She is also still grieving for her husband who passed away several years ago.  Past psychiatric history: Patient has been treated with antidepressants by her primary care doctor. She is currently taking citalopram 30 mg per day. She does not know of any other antidepressants she has ever taken. She is not clear whether the citalopram has been of any benefit. She has never been in a psychiatric hospital and denies any history of suicide attempts. No history of substance abuse.  Social history: Patient is widowed. She has adult children both a son and a daughter whom she is close to. She is currently living in a living community for elderly people. She seems to feel alienated there.  Medical history: Patient reports a recent increase in some confusion and difficulty with speaking. Also has had more fatigued. Came into the hospital with acute renal insufficiency possibly due to dehydration. Has  chronic liver problems and other medical illnesses.  Mental status exam: Elderly woman who is awake alert and cooperative. Makes good eye contact. She is quite talkative during the interview. Psychomotor activity normal. Affect is reactive tearful at times. Mood is stated as being sad. Thoughts appear to be lucid without any loosening of associations. No sign of delusions. Denies auditory or visual hallucinations. Endorses occasional passive suicidal thoughts but makes it very clear she has no intent or plan of hurting herself. Denies any homicidal ideation. Intelligence appears to be average. She does on several occasions have difficulty forming her words correctly but this is inconsistent. Assessment: Patient with multiple symptoms of major depression worse over the last couple months. In the context of significant medical problems. Also grieving not only the death of her husband but her own advancing age and loss of independence. Patient does not appear to be a high suicide risk although she does appear to require more treatment for her depression. After review of her medications I want to try not to give her anything but will worsen her cardiac condition. I will decrease her citalopram to 20 mg a day and initiate mirtazapine 30 mg at night. Supportive and educational therapy done with the patient. She should consider following up with therapy as well as medication management. Maj. depressive episode recurrent moderate. Time spent on consult 60 minutes.   Electronic Signatures: Audery Amellapacs, Susana Duell T (MD)  (Signed on 29-May-14 00:09)  Authored  Last Updated: 29-May-14 00:09 by Audery Amellapacs, Chalsey Leeth T (MD)

## 2014-07-26 NOTE — Consult Note (Signed)
Brief Consult Note: Diagnosis: Left hip AVN with subchondral collapse.   Patient was seen by consultant.   Comments: Patient seen today.Has been seen by Dr. Rosita KeaMenz several months ago to discuss possibility of THA given history of AVN.  WIth liver failure, her hepatologist told her procedure would be difficult to pursue.  Patient slid out of her bed yesterday and now presents with increased hip pain. PE:  TA, GS intact.  Good strength on hip flexion, extension, abduction, adduction.  Severe TTP in groin.  Some pain with active motion but controlled. Calf soft, NT.  Sensation grossly intact with some vague alteration of sensation to light touch of toes.  Imaging:  CT shows AVN with subchondral collapse.   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 Rosita KeaMenz as an outpatient.  If pain does not subside, may be beneficial to revisit possiblity of THA for pain control.  Patient will need to discuss with medical team feasilbilty of this.  For now, medical pain control.  Begin PT for WBAT.  Will likely need significant assistance until pain stabilizes.  Please arrange follow up with Dr. Rosita KeaMenz as outpatient.  Electronic Signatures: Murlean Harkamasunder, Kailen Hinkle (MD)  (Signed [phone]-Nov-14 14:57)  Authored: Brief Consult Note   Last Updated: 18-Nov-14 14:57 by Murlean Harkamasunder, Annais Crafts (MD)

## 2014-07-26 NOTE — Discharge Summary (Signed)
PATIENT NAME:  Nancy [name], Nancy [name] MR#:  [id number] DATE OF BIRTH:  1945/10/19  DATE OF ADMISSION:  08/29/2012  DATE OF DISCHARGE:  09/01/2012  For detailed note, please take a look at the history and physical done on admission.   DIAGNOSES AT DISCHARGE IS AS FOLLOWS:  1.  Acute on chronic renal failure secondary to intravascular depletion and chronic liver disease. 2.  Altered mental status, multifactorial, related to underlying depression, complicated with hepatic encephalopathy.  3.  Chronic liver disease.  4.  Hepatic encephalopathy.  5.  Depression.  6.  Hypertension.  7.  Chronic obstructive pulmonary disease.   The patient is being discharged home with home health nursing and physical therapy services.   ACTIVITY:  As tolerated.   FOLLOWUP:  With Dr. Dennison MascotMorrisey Lemont in the next 1 to 2 weeks. The patient is being discharge on a low-sodium, low-fat, carb-controlled diet.   DISCHARGE MEDICATIONS:  1.  Glyburide 4 mg daily.  2.  Symbicort 80/4.5, 2 puffs b.i.d. 3.  Synthroid 100 mcg daily.  4.  Albuterol inhaler 2 puffs q.6h. as needed.  5.  Cyclobenzaprine 5 mg b.i.d. 6.  Oxycodone 10 mg q.8h. as needed.  7.  Zinc gluconate 50 mg daily.  8.  Nexium 40 mg b.i.d. 9.  Gabapentin 300 mg in the morning 1 cap in the midafternoon and 2 capsules at bedtime.  10.  Neomycin 5 mg 2 tabs b.i.d. 11.  Lantus 48 units at bedtime.  12.  Amlodipine 10 mg daily.  13.  Spiriva 1 puff daily.  14.  OxyContin 10 mg q.12h. 15.  Lactulose 30 mL b.i.d.  16.  Lasix 40 mg daily.  17.  Celexa 10 mg, 2 tabs daily.  18.  Remeron 30 mg at bedtime.   CONSULTANTS DURING THE HOSPITAL COURSE:  Dr. Toni Amendlapacs from psychiatry and Dr. Mady HaagensenMunsoor Lateef from nephrology.   PERTINENT STUDIES DONE DURING THE HOSPITAL COURSE ARE AS FOLLOWS:  A CT of the head done without contrast on admission showing chronic involutional changes, without acute evidence of any abnormalities. A chest x-ray done on admission showing no acute  cardiopulmonary disease. An ultrasound of the kidneys showing a right renal cyst, otherwise unremarkable renal ultrasound.   HOSPITAL COURSE:  This is a 69 year old female with medical problems as mentioned above. Presented to the hospital secondary to altered mental status and confusion and also noted to be in acute chronic and renal failure.   1.  Acute on chronic renal failure. The likely cause of this was probably intravascular depletion from hypoalbuminemia and chronic liver disease. The patient's baseline creatinine is 1.5. She presented to the hospital with a creatinine of 2.8. She was first gently hydrated with IV fluids. A nephrology consult was obtained, as she is well known to the nephrology service. They started her on some albumin infusions on top of the fluids. Her creatinine has significantly improved since admission, now down to 1.8. She is currently is being discharged on low-dose diuretics with Lasix 40 mg daily and followup with nephrology as an outpatient. A renal ultrasound did not show any evidence of acute hydronephrosis.  2.  Altered mental status/confusion. The exact etiology of this was unclear, but likely multifactorial in nature. The patient had periods where she became quite tearful and then was confused at times. Some of this was related to her hepatic encephalopathy, also complicated with underlying depression. She was also discharged on fentanyl recently, which I discontinued. I started her on some OxyContin extended  release, along with oxycodone p.r.n. for pain and I maintained her on lactulose and neomycin. I also obtained a psychiatric consult and Dr. Toni Amend evaluated her. He lowered her dose of Celexa and started her on some Remeron. At this point, her mental status has improved. She currently is being discharged on the current management for pain with OxyContin and oxycodone, maintenance of lactulose and neomycin and she will continue some Remeron at home and follow up with  psychiatry as an outpatient.  3.  Diabetes. The patient had no evidence of any hypoglycemic episodes. She will continue her glimepiride and Lantus.  4.  Chronic back pain. She was on fentanyl prior to coming in. I discontinued that. I started her on some OxyContin and p.r.n. oxycodone for pain. She seems to be pretty comfortable with this regimen. She will continue that.  5.  Hypertension. The patient was maintained on Norvasc. She will resume that.  6.  Chronic obstructive pulmonary disease. She had no evidence of any chronic obstructive pulmonary disease exacerbation. She will continue with Symbicort and Spiriva.  7.  Diabetic neuropathy. The patient was maintained on Neurontin. She will resume that.  8.  Hypothyroidism. The patient was maintained on her Synthroid. She would also resume that upon discharge.  9.  Depression. The patient was maintained on her Celexa. The dose was adjusted by psychiatry down from 30 mg to 20 mg. She was also started on some Remeron and she will resume that and follow up with Dr. Garnetta Buddy from psychiatry as an outpatient. The patient is being discharged home with home health physical therapy and nursing services.   TIME SPENT:  Forty minutes.   ____________________________ Rolly Pancake. Cherlynn Kaiser, MD vjs:nts D: 09/01/2012 16:24:16 ET T: 09/02/2012 05:56:47 ET JOB#: [id number]  cc: Rolly Pancake. Cherlynn Kaiser, MD, <Dictator> Dennison Mascot, MD Houston Siren MD ELECTRONICALLY SIGNED 09/10/2012 20:34

## 2014-07-26 NOTE — Discharge Summary (Signed)
PATIENT NAME:  Nancy FossaSMITH, Nicky E MR#:  [id number] DATE OF BIRTH:  06-01-1945  DATE OF ADMISSION:  09/10/2012 DATE OF DISCHARGE:    ADMITTING DIAGNOSIS: Altered mental status.   DISCHARGE DIAGNOSES:  1.  Altered mental status due to acute hepatic encephalopathy. Now, the patient's mental status is back to baseline.  2.  Chronic liver disease with liver cirrhosis.  3.  Depression.  4.  Chronic back pain.  5.  Acute on chronic renal failure with chronic kidney disease, stage III.  6.  Diabetes.  7.  Hypertension.  8.  Chronic obstructive pulmonary disease.   CONSULTANTS: None.   PERTINENT LABS AND EVALUATIONS: Admitting EKG normal sinus rhythm with nonspecific T wave changes. Urinalysis: Nitrites negative, leukocytes negative. Troponin less than 0.02. BMP: Glucose 106, BUN 33, creatinine 1.88, sodium 139, potassium 4.8, chloride 111, CO2 was 19, calcium 9.1, bilirubin total 0.9, alkaline phosphatase 221, ALT 29, AST was 69, total protein 8.3, albumin of 3.5. WBC count 2.9, hemoglobin 9.0, platelet count was 93. Ammonia level was 138. Creatinine today is 1.54, ammonia level was 49.   HOSPITAL COURSE: Please refer to H and P done by the admitting physician. The patient is a 69 year old white female with history of liver cirrhosis, diabetes, hypertension and multiple admissions for hepatic encephalopathy, who was brought to the ED with altered mental status. The patient's ammonia level was noted to be significantly elevated on presentation. She was started on lactulose via NG-tube. With this treatment, her mental status improved significantly. She was re-resumed on lactulose. At this time, she is doing much better. The patient has been prescribed rifaximin in the past, but is unable to afford it. I have filled out the assistance paperwork for possible assistance with this medication for this patient. At this time, she is stable for discharge.   DISCHARGE MEDICATIONS: Glimepiride 4 mg daily, Symbicort  80/4.5 mcg 2 puffs b.i.d., levothyroxine 100 mcg daily, Pro-Air 2 puffs q. 6 hours p.r.n.,  5 mg 1 tab p.o. b.i.d., zinc gluconate 50 mg 1 tab p.o. daily, Nexium 40 mg 1 tab p.o. b.i.d., gabapentin 300 mg 1 tab p.o. every morning, 1 capsule in the mid afternoon and 2 capsules at bedtime, neomycin 500 mg 2 tabs 2 times a day during the week Monday through Friday, Lantus 48 units at bedtime, amlodipine 10 mg daily, oxycodone 10 mg 1 tab p.o. q. 12 hours, lactulose 30 mL 2 times a day, Lasix 40 mg daily, citalopram 10 mg 2 tabs daily, mirtazapine 30 mg at bedtime, Spiriva 18 mcg daily,  rifiamin 550 mg 1 tab p.o. b.i.d.   Home health to be resumed as previously.   DIET: Low-sodium, low-fat, low-cholesterol.   ACTIVITY: As tolerated.   FOLLOWUP: With Dr. Irven EasterlyLamont Morrisey in 1 to 2 weeks.   NOTE: 32 minutes spent on the discharge.  ____________________________ Lacie ScottsShreyang H. Allena KatzPatel, MD shp:aw D: 09/12/2012 13:09:14 ET T: 09/12/2012 13:41:08 ET JOB#: [id number]  cc: L[place] H. Allena KatzPatel, MD, <Dictator> Charise CarwinSHREYANG H Sophonie Goforth MD ELECTRONICALLY SIGNED 09/16/2012 15:23

## 2014-07-26 NOTE — Discharge Summary (Signed)
PATIENT NAME:  Nancy [name], [name] MR#:  [id number] DATE OF BIRTH:  1946/01/07  DATE OF ADMISSION:  10/30/2012 DATE OF DISArmy FossaCHARGE:  11/06/2012   PRIMARY CARE PHYSICIAN:  Dr. Thana AtesMorrisey.    DISCHARGE DIAGNOSES: 1.  Anasarca. 2.  Ascites, with liver cirrhosis. 3.  Left hip degenerative joint disease.   PROCEDURE:  Paracentesis.    CODE STATUS: Full code.   CONDITION: Stable.   HOME MEDICATIONS: Symbicort 7180mcg/4.5 mcg inhalation aerosol 2 puffs b.i.d., levothyroxine 100 mcg p.o. daily, ProAir HFA 90 mcg inhalation aerosol 2 puffs every six hours p.r.n., zinc gluconate 50 mg p.o. daily, Nexium 40 mg p.o. b.i.d., gabapentin 300 mg one cap once a day in the morning and one cap once a day during midafternoon and 2 caps at night,  Lasix 40 mg p.o. daily, citalopram10 mg two tabs once a day, mirtazapine 30 mg p.o. at bedtime, Spiriva 18 mcg inhalation one cap once a day, oxycodone 10 mg p.o. 1 tab every eight hours p.r.n., NovoLog sliding scale, rifaximin 50 mg p.o. b.i.d., DuoNebs 0.5/2.5 mg per 3 mL inhalations solution 3 mL 4 times a day p.r.n.   DIET: Low sodium diet.   ACTIVITY: As tolerated.   FOLLOW-UP CARE: Follow with PCP within 1 to 2 weeks, follow-up Dr. Rosita KeaMenz within 1 to 2 weeks.   REASON FOR ADMISSION: Worsening pedal edema and abdominal distention.   HOSPITAL COURSE: The patient is a 69 year old Caucasian female with a history of liver cirrhosis, chronic back pain, hypertension, diabetes, CKD, presents to the ED with worsening pedal edema, also abdominal distention. The patient's ammonia level was 31. Her labs were otherwise stable. Please refer to the admission note dictated by Dr. Nemiah CommanderKalisetti.  LABORATORY, DIAGNOSTIC AND RADIOLOGIC DATA: On admission date labs were as follows: INR 1.2, PTT 37, ammonia 31, WBC 4.3, hemoglobin 9.5, BUN 35, creatinine 1.5, bicarbonate 26.   Chest x-ray showed interstitial infiltrate and pulmonary edema.   1.  The patient was admitted for worsening anasarca  with pedal edema and pulmonary edema. After admission, the patient was treated with Lasix 40 mg IV b.i.d. Ultrasound guided paracentesis was done and about 2 liters of fluid was removed.  2.  Left hip pain with degenerative joint disease. Consult with Dr. Rosita KeaMenz.  The patient may need replacement. She needs to follow up with him as outpatient.  3.  Diabetes. The patient has been on sliding scale.  4.  Chronic kidney disease, stable.  5.  After above-mentioned treatment, the patient's anasarca has much improved, so Lasix was decreased to 40 mg daily. The patient is clinically stable. According to physical therapy evaluation, the patient needs skilled nursing facility placement and patient agrees. The case manager is looking for skilled nursing facility. The patient will be discharged to a skilled nursing facility when bed is available. I discussed the patient's discharge plan with the patient, case manager and nurse.   TIME SPENT: About 38 minutes.   ____________________________ Shaune PollackQing Delight Bickle, MD qc:cc D: 11/06/2012 13:20:00 ET T: 11/06/2012 14:20:00 ET JOB#: [id number]  cc: Shaune PollackQing Cornie Mccomber, MD, <Dictator> Shaune PollackQING Schylar Wuebker MD ELECTRONICALLY SIGNED 11/07/2012 13:48

## 2014-07-26 NOTE — Consult Note (Signed)
PATIENT NAME:  Nancy [name], Nancy [name] MR#:  [id number] DATE OF BIRTH:  Oct 16, 1945  DATE OF CONSULTATION:  08/15/2012  CONSULTING PHYSICIAN:  Ashlynne Shetterly S. Garnetta Buddy, MD  REASON FOR CONSULT: Depression.   HISTORY OF PRESENT ILLNESS: The patient is a 69 year old pleasant cooperative female who was admitted due to acute renal failure, bradycardia and depression. She was recently discharged to a nursing home, and she went to the rehab and then came back again because of worsening of her depressive symptoms. The patient reported that she was admitted because of her increase in the creatinine. She was having decreased p.o. intake and was feeling depressed. She also has bilateral lower extremity edema, and she reported that she was taking 1 dose of Lasix.   During my interview, the patient was sitting in the bed. She reported that she has living by herself, but she has been feeling depressed because she does not want to be a burden on her family. She reported that she has discussed with her daughter and her son about her situation, as she has several medical problems and she wants to be part of the family, but she does not want to be becoming a burden on them. She reported that she gets her medications from her primary care physician, and she feels that as she is getting older she is becoming a burden on them. She reported that her daughter is very supportive, and she has a Sports administrator, and she has spoken to her multiple times that she should not worry about her medical bills, but the patient keeps on worrying about the same. She reported that she wants to go out and do things for herself, but then she starts worrying about different issues. The patient reported that she does not have any thoughts to harm herself. She sleeps well. She has several medical conditions. She reported that she has friends at the same place where she lives, but sometimes she does not get along well with them. She denied having any thoughts to harm  herself at this time. She is willing to have medications to help with her anxiety at this time.   PAST PSYCHIATRIC HISTORY: The patient has been diagnosed with depression and has been described citalopram and BuSpar which she has been taking on a regular basis. She reported that she has not taken notice of any worsening of her symptoms.   MEDICAL HISTORY: The patient was admitted for: 1.  Acute renal failure which was secondary to dehydration as well as bradycardia.  2.  History of liver cirrhosis secondary to over-the-counter medications and Tylenol.  3.  GERD.  4.  Hypothyroidism.  5.  Diabetes.  6.  Hyperlipidemia.  7.  Hypertension.  8.  COPD.   9.  Atrial fibrillation.  10.  Anemia of chronic disease. 11.  Chronic thrombocytopenia.  12.  Hysterectomy.  13.  Cholecystectomy.  14.  Cataracts.   ALLERGIES: ASPIRIN AND TYLENOL.   SOCIAL HISTORY: The patient currently lives in a senior apartment complex.  She drives by herself and is active at this time.   FAMILY HISTORY: Positive for hypertension and Parkinson's disease.   PHYSICAL EXAMINATION: VITAL SIGNS: Temperature 98.4, pulse 49, respirations 12 blood pressure 141/48.   LABORATORY DATA: Glucose 242,  BUN 36, creatinine 2.63, sodium 138, potassium 34.6, chloride 112, bicarbonate 21.  Anion gap 18.  Osmolality 5. Ammonia 8.0.   MENTAL STATUS EXAMINATION: The patient is a moderately built female who appeared her stated age. She was calm and  cooperative. She maintained fair eye contact. Her mood was depressed. Affect was congruent. Thought process was logical and goal-directed. Thought content was nondelusional. She currently denied having any suicidal or homicidal ideations or plans. She denied having any impulsive behavior.   DIAGNOSTIC IMPRESSION: AXIS I:  1.  Major depressive disorder, recurrent, moderate.  2.  Anxiety disorder.   AXIS II: None.   AXIS III: Please review the medical history.   TREATMENT PLAN:  1.  I  discussed with her at length about the medications, treatment risks, benefits and alternatives.  2.  I will start her on the BuSpar 10 mg p.o. b.i.d. to help with her anxiety symptoms.  3.  She will continue on citalopram 30 mg p.o. daily.  4.  I discussed with her about the outpatient management of her psychotropic medications. She agreed with the plan.  5.  The patient will be following up in outpatient once she is clinically stable and discharged from the hospital.   Thank you for allowing me to participate in the care of this patient.   ____________________________ Ardeen FillersUzma S. Garnetta BuddyFaheem, MD usf:cb D: 08/15/2012 17:27:37 ET T: 08/15/2012 17:42:30 ET JOB#: [id number]  cc: Ardeen FillersUzma S. Garnetta BuddyFaheem, MD, <Dictator> Rhunette CroftUZMA S Harace Mccluney MD ELECTRONICALLY SIGNED 08/17/2012 13:23

## 2014-07-26 NOTE — H&P (Signed)
PATIENT NAME:  Nancy [name], Nancy [name] MR#:  [id number] DATE OF BIRTH:  Mar 13, 1946  DATE OF ADMISSION:  08/20/2012  REFERRING PHYSICIAN: Governor Rooks, MD   FAMILY PHYSICIAN: Dennison Mascot, MD  REASON FOR ADMISSION: Acute respiratory failure with hypoglycemia.   HISTORY OF PRESENT ILLNESS: The patient is a 69 year old female discharged from our facility 3 days ago, who presents to the Emergency Room with altered mental status where she was found to be hypoglycemic with a blood sugar of 30. She was also complaining of weakness and shortness of breath. In the Emergency Room, the patient was hypoxic with grossly abnormal lung exam. Chest x-ray suggested congestive heart failure, as did her elevated BNP. However, on exam, she was extremely rhonchorous and wheezy, consistent with COPD exacerbation. She does have a history of tobacco abuse but quit greater than 10 years ago. While in the Emergency Room, the patient was given some food, at which time she choked and questionably aspirated. She is now admitted for further evaluation.   PAST MEDICAL HISTORY: 1.  Stage III chronic kidney disease.  2.  History of acute renal failure due to dehydration.  3.  Liver cirrhosis with chronically abnormal LFTs.  4.  GE reflux disease.  5.  Hypothyroidism.  6.  Type 2 diabetes.  7.  Hyperlipidemia.  8.  Benign hypertension.  9.  Oxygen-dependent COPD. 10.  Remote history of atrial fibrillation.  11.  Anemia of chronic disease.  12.  Chronic thrombocytopenia.  13.  Status post hysterectomy.  14.  Status post cholecystectomy.   MEDICATIONS: 1.  Lantus 48 units subcu at bedtime.  2.  Amaryl 4 mg p.o. daily.  3.  Symbicort 80/4.5, 2 puffs b.i.d.  4.  Synthroid 100 mcg p.o. daily.  5.  Albuterol 2 puffs q.6 hours p.r.n.  6.  Flexeril 5 mg p.o. b.i.d.  7.  Celexa 30 mg p.o. daily.  8.  Oxycodone 10 mg p.o. q.8 hours p.r.n. pain.  9.  Nexium 40 mg p.o. twice a day.  10.  Gabapentin 300 mg p.o. b.i.d. and 600 mg  p.o. at bedtime.  11.  Spiriva 1 capsule inhaled daily.  12.  Amlodipine 10 mg p.o. daily.   ALLERGIES: ASPIRIN AND TYLENOL.   SOCIAL HISTORY: The patient quit smoking over 10 years ago. Denies alcohol abuse. She lives in a senior apartment complex.   FAMILY HISTORY: Positive for hypertension and Parkinson's disease.   REVIEW OF SYSTEMS:  CONSTITUTIONAL: No fever or change in weight.  EYES: No blurred or double vision. No glaucoma.  ENT: No tinnitus or hearing loss. No nasal discharge or bleeding. No difficulty swallowing.  RESPIRATORY: The patient has had cough and wheezing. Denies hemoptysis.  CARDIOVASCULAR: No chest pain or orthopnea. No syncope.  GASTROINTESTINAL: No nausea, vomiting or diarrhea. No abdominal pain. No change in bowel habits.  GENITOURINARY: No dysuria or hematuria. No incontinence.  ENDOCRINE: Denies polyuria or polydipsia. No heat or cold intolerance.  HEMATOLOGIC: The patient admits to anemia. Denies bruising or bleeding.  LYMPHATIC: No swollen glands.  MUSCULOSKELETAL: The patient has pain in her neck, back and hips. No knee pain. No gout.  NEUROLOGIC: No numbness although she does have weakness. Denies migraines, stroke or seizures.  PSYCH: The patient denies anxiety, insomnia or depression.   PHYSICAL EXAMINATION: GENERAL: The patient is acutely ill-appearing, in moderate distress.  VITAL SIGNS: Remarkable for a blood pressure of 131/61 with a heart rate of 77 and a respiratory rate of 23. She is afebrile.  HEENT: Normocephalic, atraumatic. Pupils equally round and reactive to light and accommodation. Extraocular movements are intact. Sclerae anicteric. Conjunctivae are clear. Oropharynx is clear.   NECK: Supple without JVD. No adenopathy or thyromegaly was noted.  LUNGS: Basilar crackles with diffuse wheezes and rhonchi. There is no dullness. Respiratory effort is increased.  CARDIAC: Regular rate and rhythm with normal S1, S2. No significant rubs or  gallops.  ABDOMEN: Soft, nontender, with normoactive bowel sounds. No organomegaly or masses were appreciated. No hernias or bruits were noted.  EXTREMITIES: Reveal 1+ edema with stasis changes. Pulses are 1+ bilaterally.  SKIN: Warm and dry without rash or lesions.  NEUROLOGIC: Cranial nerves II through XII grossly intact. Deep tendon reflexes were symmetric. Motor and sensory examination is nonfocal.  PSYCH: The patient was alert and oriented to person, place and time. She was cooperative and used good judgment.   LABORATORY DATA: EKG revealed sinus rhythm with no acute ischemic changes. Chest x-ray showed pulmonary vascular congestion consistent with CHF. Urinalysis negative. White count 5.4 with a hemoglobin of 9.8. Glucose was 32 with a BUN of 29 and a creatinine of 1.69 with a GFR of 31 and a sodium of 135 with a potassium of 5.0. Troponin was less than 0.02. Serum ammonia was 64. BNP was 1048.   ASSESSMENT: 1.  Acute respiratory failure.  2.  Chronic obstructive pulmonary disease exacerbation.  3.  Presumed systolic congestive heart failure.  4.  Stage III chronic kidney disease.  5.  Hypoglycemia.  6.  Type 2 diabetes.  7.  Anemia of chronic disease.  8.  Chronic pain.   PLAN: The patient will be admitted to telemetry as a full code. She will be started on IV Lasix for her CHF at this time as well as oxygen. She was given IV steroids in the Emergency Room x 1. We will begin empiric IV antibiotics with DuoNeb SVNs and continue her Symbicort and Spiriva. We will follow up a chest x-ray in the morning and wean her oxygen as tolerated. We will obtain a nephrology consult because of her stage III chronic kidney disease. We will decrease Lantus dramatically at this time and hold her Amaryl. We will follow her sugars with Accu-Cheks before meals and at bedtime and add sliding scale insulin as needed. Follow up routine labs in the morning. We will consult physical therapy as well as speech therapy  given her questionable aspiration while in the Emergency Room. We will obtain a case manager consult for placement. We will repeat echocardiogram and compare to one she had done a month ago. Dysphagia diet for now. Further treatment and evaluation will depend upon the patient's progress.   TOTAL TIME SPENT ON THIS PATIENT: 50 minutes.   ____________________________ Duane LopeJeffrey D. Judithann SheenSparks, MD jds:cs D: 08/20/2012 13:40:47 ET T: 08/20/2012 14:48:15 ET JOB#: [id number]  cc: Duane LopeJeffrey D. Judithann SheenSparks, MD, <Dictator> Dennison MascotLemont Morrisey, MD Maecyn Panning Rodena Medin Nathanie Ottley MD ELECTRONICALLY SIGNED 08/21/2012 16:33

## 2014-07-26 NOTE — H&P (Signed)
PATIENT NAME:  [name], [name] MR#:  [id number] DATE OF BIRTH:  09/01/1945  DATE OF ADMISSION:  10/30/2012  ADMITTING PHYSICIAN: Radhika Kalisetti, M.D.   PRIMARY CARE PHYSICIAN:  Dr. Morrisey.   CHIEF COMPLAINT: Worsening pedal edema and also abdominal distention.   HISTORY OF PRESENT ILLNESS:  Nancy [name] is a 69-year-old Caucasian female with past medical history significant for liver cirrhosis, chronic back pain, hypertension, diabetes, chronic kidney disease, who has had multiple admissions secondary to hepatic encephalopathy. The last one, she was just discharged from the hospital on 10/13/2012, comes back from home secondary to worsening edema. Her prior admissions were mostly from hepatic encephalopathy, but this time she is more clear.  Her ammonia level is within normal limits at 31. The patient says she usually walks with a walker but the walking has been getting more difficult over the last week   due to worsening edema. Over the last couple of days, she was even unable to get out of bed due to worsening pedal edema, which was causing shooting leg pain to both legs and also worsening abdominal swelling and distention, which was causing difficulty breathing.  Her labs are otherwise stable, so she is being admitted for anasarca at this time.   PAST MEDICAL HISTORY: 1.  Chronic liver disease with cirrhosis secondary to prior use of multiple over-the-counter and herbal medications.  2.  Chronic low back pain.  3.  Depression.  4.  Diabetes mellitus, only on sliding scale insulin.  5.  CKD with baseline creatinine of 1.5.  6.  Hypertension.  7.  Chronic obstructive pulmonary disease.   PAST SURGICAL HISTORY: 1.  Cataract surgery.  2.  Liver biopsy.   ALLERGIES TO MEDICATIONS:  1.  ASPIRIN.  2.  NOT ADVISED TO TAKE ANY TYLENOL SECONDARY TO LIVER DISEASE.   CURRENT HOME MEDICATIONS.   1.  Lactulose 30 mL p.o. b.i.d.  2.  Gluconate 50 mg p.o. daily.  3.  Symbicort 80/4,5 mcg 2 puffs  b.i.d.  4.  Spiriva capsule inhalation daily.  5.  Rifaximin 550 mg p.o. b.i.d.  6.  ProAir inhaler 2 puffs q.6 hours p.r.n. for wheezing.  7.  Oxycodone 10 mg q.8 hours p.r.n. for pain.  8.  NovoLog subcutaneous solution sliding scale insulin.  9.  Nexium 40 mg p.o. b.i.d.  10.  Remeron 30 mg at bedtime.  11.  Levothyroxine 100 mcg p.o. daily.  12.  Lasix 40 mg p.o. daily.  13.  Gabapentin 300 mg p.o. in the morning and 300 mg in the afternoon and 600 mg at bedtime.  14.  DuoNeb 3 mL 4 times a day as needed.  15.  Celexa 20 mg p.o. daily.   SOCIAL HISTORY: Used to live by herself up until the last week, now living with daughter.  No history of any smoking, alcohol or drug use. Walks with a walker at baseline.   FAMILY HISTORY: Father was healthy but died in his 50s in a motor vehicle accident, mom with hypertension and Parkinson disease.     REVIEW OF SYSTEMS:   CONSTITUTIONAL: No fever, fatigue. Positive for weakness.  EYES: No blurred vision, double vision, inflammation or glaucoma. Uses reading glasses and has had cataract surgery.  EARS, NOSE, THROAT: No tinnitus, ear pain, hearing loss, epistaxis or discharge.  RESPIRATORY: No cough, wheeze, hemoptysis or dyspnea. History of COPD present.  CARDIOVASCULAR: No chest pain. Positive for orthopnea and dyspnea on exertion. Positive for pedal edema. No arrhythmias, palpitations or   syncope.  GASTROINTESTINAL: No nausea, vomiting or diarrhea. Positive for abdominal distention and pain. No hematemesis or rectal bleed.  GENITOURINARY:  No dysuria, hematuria, renal calculus, frequency or incontinence.  ENDOCRINE: No polyuria, nocturia, thyroid problems, heat or cold intolerance.  HEMATOLOGY: No anemia, easy bruising or bleeding.  SKIN: Positive for multiple rashes, spider nevi on the skin.  MUSCULOSKELETAL: Positive for chronic low back pain. No gout or arthritis.  NEUROLOGIC: No numbness, weakness, CVA, TIA or seizures.  PSYCHOLOGICAL: No  anxiety, insomnia, depression.   PHYSICAL EXAMINATION: VITAL SIGNS: Temperature 98 degrees Fahrenheit, pulse 74, respirations 24, blood pressure 194/75, pulse ox 98% on 2 liters oxygen.  GENERAL: Well-built, well-nourished female sitting in bed, in mild respiratory distress.  HEENT: Normocephalic, atraumatic. Pupils are equal, round, postsurgical, reacting to light. Anicteric sclerae. Extraocular movements intact. Oropharynx without erythema, mass or exudates.  NECK: Supple. No thyromegaly, JVD or carotid bruits. No lymphadenopathy.  LUNGS: Moving air bilaterally. Coarse crackles, posterior lower two thirds of the lungs heard.   No use of accessory muscles for breathing at rest.  CARDIOVASCULAR: S1, S2, regular rate and rhythm, 3/6 systolic murmur heard. No rubs or gallops.  ABDOMEN: Distended and firm. Mild discomfort on palpation in the right upper and left upper quadrants. No guarding or rebound tenderness. Bowel sounds are normal. Unable to palpate hepatosplenomegaly because of the nondistended abdomen.  EXTREMITIES  Pedal edema 3+ up to the knees. No clubbing or cyanosis. Feet with dorsalis pedis pulses palpable bilaterally.  SKIN: She has spider nevi and erythematous rash on her face, and also the upper chest. Otherwise, no acne, rash or lesions.  LYMPHATICS: No cervical lymphadenopathy.  NEUROLOGICAL: Cranial nerves are intact. Motor strength is 5/5 in all 4 extremities. Deep tendon reflexes are 2+ symmetric, all 4 extremities, and sensation is intact.  PSYCHOLOGICAL:  The patient is awake, alert, oriented x 3.   RADIOLOGIC, DIAGNOSTIC AND LABORATORY DATA: INR is 1.2, PTT 37.1, plasma 31. WBC 4.3, hemoglobin 9.5, hematocrit 29.5, platelet count 102.   Sodium 135, potassium 3.6, chloride 101, bicarb 26, BUN 35, creatinine 1.5, glucose 84 and calcium of 9.0.   ALT 24, AST 47, alk phos 289, total bilirubin 1.1 and albumin of 3.8. Urinalysis with 1+ leuk esterase, few WBCs and bacteria.  Troponins negative. BNP is slightly elevated at 867. AST is 48. ALT is 25. Alk phos 257, total bilirubin is 1.1 and albumin of 2.7.  Chest x-ray showing interstitial infiltrate representing pulmonary edema.  EKG showing normal sinus rhythm, heart rate of 71.   ASSESSMENT AND PLAN: A 69 year old female with known history of liver cirrhosis, hepatic encephalopathy, chronic kidney disease stage III, hypertension, diabetes, being admitted for anasarca and worsening pedal edema.  1.  Worsening anasarca with pedal edema and pulmonary edema on exam. Last echo with EF of 60%. No known cardiac abnormality. Likely from hypoalbuminemia and liver disease. Lasix 40 mg IV b.i.d. is being started. Ultrasound-guided paracentesis requested for the patient, although not on Aldactone due to renal failure. We will continue to hold at this time.  2.  Liver cirrhosis.  LFTs elevated but stable. Paracentesis is requested, as mentioned above.   3.  History of hepatic encephalopathy. Currently ammonia is within normal limits, and the patient's mental status is normal. Continue lactulose at home dose. 4.  Diabetes mellitus. Continue sliding scale insulin. HbA1c of 6.0.  5  Chronic back pain.  She is on Roxicodone.  6.  Chronic kidney disease stage III. Baseline creatinine of  1.5, stable for now. Monitor while on Lasix.  7.  Gastrointestinal and deep vein thrombosis prophylaxis. The patient is on Nexium b.i.d. and also ordered TEDs and SCDs now, as she will need it for thoracentesis. We will not give her any antiplatelet agents or anticoagulants.   CODE STATUS: Full code.   TIME SPENT ON ADMISSION: 50 minutes.      ____________________________ Radhika Kalisetti, MD rk:dmm D: 10/30/2012 12:23:57 ET T: 10/30/2012 12:55:03 ET JOB#: [id number]  cc: Radhika Kalisetti, MD, <Dictator> Lemont Morrisey, MD RADHIKA KALISETTI MD ELECTRONICALLY SIGNED 11/06/2012 15:00 

## 2014-07-26 NOTE — H&P (Signed)
PATIENT NAME:  Nancy [name], Nancy [name] MR#:  [id number] DATE OF BIRTH:  04/11/1945  DATE OF ADMISSION:  09/10/2012  PRIMARY CARE PHYSICIAN:  Dr. Starleen Blue  REFERRING PHYSICIAN:  Dr. Nehemiah Massed  CHIEF COMPLAINT:  Altered mental status.  HISTORY OF PRESENT ILLNESS:  Miss Pesce is a 69 year old white female with a history of cirrhosis, diabetes mellitus, hypertension, multiple admissions for hepatic encephalopathy, chronic renal insufficiency, chronic pain, was brought to the Emergency Department for altered mental status. The patient's daughter tried to call patient; however, did not get any response. Concerning this, EMS was called. Per daughter, the patient was doing well until last night. Unable to obtain any history from the patient. The patient is completely disoriented to place, person and time. Restless in the bed. The patient had multiple admissions in the past for hepatic encephalopathy. Felt to be some noncompliance. The patient was also admitted for multiple admissions for acute on chronic renal insufficiency. The patient is also on multiple sedative medications with OxyContin, gabapentin, mirtazapine, Flexeril. The patient's daughter states that she has pain in between the shoulder blades. For this reason, patient takes multiple pain medications. Per daughter, there was no history of any nausea, vomiting, abdominal pain or  fever. On workup in the Emergency Department, patient was found to have elevated ammonia level of 138. The patient's usual baseline is about 50. No obvious signs of infection were found.   PAST MEDICAL HISTORY: 1.  Chronic liver disease.  2.  Depression.  3.  Chronic back pain.  4.  Chronic kidney disease stage 3.  5.  Diabetes mellitus, insulin-dependent.  6.  Hypertension.  7.  COPD.    ALLERGIES:  ASPIRIN and    HOME MEDICATIONS: 1.  Lactulose 30 mg 2 times a day.  2.  Zinc gluconate 50 mg once a day.  3.  Symbicort 80 mcg 2 puffs 2 times a day.  4.  Spiriva 18  mcg 1 capsule daily.  5.  ProAir 90 mcg every 6 hours.  6.  OxyContin 10 mg every 12 hours. 7.  Nexium 40 mg 2 times a day.  8.  Neomycin 500 mg 2 times a day.  9.  Mirtazapine 30 mg once a day.  10.  Levothyroxine 100 mcg once a day.  11.  Lasix 40 mg once a day.  12.  Lantus 48 units subcutaneous once a day.  13.  Lactulose 30 mL once a day.  14.  Glimepiride 4 mg oral once a day.  15.  Gabapentin 300 mg once a day.  16.  Flexeril 5 mg 2 times a day.  17.  Citalopram 10 mg 2 times a day.  18.  Amlodipine 10 mg once a day.   SOCIAL HISTORY:  No history of smoking, alcohol or drug use. Currently lives by herself.   FAMILY HISTORY:  Positive for hypertension and Parkinson's disease.   REVIEW OF SYSTEMS:  Could not be obtained from the patient secondary to altered mental status.   PHYSICAL EXAMINATION: GENERAL: This is a well-built, well-nourished, ill-looking female lying down in the bed somewhat restless.  VITAL SIGNS: Temperature 98.3, pulse 68, blood pressure 150/65, respiratory rate of 18, oxygen saturation is 97% on room air.  HEENT:  Head normocephalic, atraumatic. Eyes:  No scleral icterus. Conjunctivae normal. Pupils equal and react to light. Could not examine the extraocular movements. Mucous membranes: Mild dryness. Could not examine the oropharynx.  NECK:  Supple. No lymphadenopathy. No JVD. No carotid bruit.  CHEST:  Has no focal tenderness.  LUNGS:  Bilaterally clear to auscultation.  HEART:  S1, S2 regular. No murmurs are heard.  ABDOMEN:  Bowel sounds present. Soft, nontender, nondistended. Could not appreciate any hepatosplenomegaly.  EXTREMITIES:  No pedal edema. Pulses 2+.  MUSCULOSKELETAL:  Good range of motion in all the extremities. Moving all 4 extremities.  SKIN:  No rash or lesions.  LYMPHATIC:  No inguinal or axillary lymphadenopathy.   LABS:  Cardiac enzymes are well within normal limits. CBC:  WBC of 2.9, hemoglobin 9, platelet count of 93. CMP: BUN 33,  creatinine of 1.88, which is patient's baseline, bicarb of 19. The rest of all the values are within normal limits. Alk phos of 229, AST 69, ALT 29. Troponin less than 0.02. UA negative for nitrites, leukocyte esterase. Ammonia level of 138.   ASSESSMENT AND PLAN:  Miss Testerman is a 69 year old female with multiple admissions for hepatic encephalopathy, comes to the Emergency Department with altered mental status.   1.  Altered mental status. This is a combination of hepatic encephalopathy and being on multiple sedative medications. Keep the NG tube, start lactulose, hold all sedative medications, and follow up.   2.  Hepatic encephalopathy.  Concerned about noncompliance with the lactulose. The patient is also on neomycin. Patient's daughter requests if patient qualifies for rifaximin. Will consult Case Management for any medication assistance.   3.  Diabetes mellitus. Considering patient being n.p.o., will decrease the dose of the Lantus to 20 units from 42 units. Continue with sliding scale insulin and Accu-Cheks.   4.  Hypertension. Currently moderately controlled. This could be secondary to some agitation.   5.  Chronic pain. Will strongly consider decreasing the dose of the pain medications, especially considering long-acting pain medications. Will hold for now and follow up.   6.  Keep the patient on deep vein thrombosis prophylaxis with sequential compression devices.   TIME SPENT:  21 minutes, reviewing old recs, discussing with the patient's daughter, and the documentation.   ____________________________ Monica Becton, MD pv:mr D: 09/10/2012 17:37:26 ET T: 09/10/2012 18:28:18 ET JOB#: [id number]  cc: Monica Becton, MD, <Dictator> Ashok Norris, MD  Monica Becton MD ELECTRONICALLY SIGNED 09/10/2012 19:52

## 2014-07-26 NOTE — Consult Note (Signed)
Brief Consult Note: Diagnosis: Dysphagia, hx NASH cirrhosis.   Patient was seen by consultant.   Consult note dictated.   Comments: Ms. Nancy [name] is a pleasant 69 y/o female with chronic dysphagia to both solids & liquids.  She has multiple comorbid medical conditions including recent respiratory failure, NASH cirrhosis with hx of hepatic encephalopathy well controlled on lactulose, CKD, COPD, CHF & DM, now with positive D Dimer & pending VQ scan tomorrow to r/o PE.  Platelet count is 81 placing her at increased risk for bleeding with esophageal dilation.  Last EGD by Dr Cecelia ByarsHashmi was normal without stricture or varices in 2011.  I discussed with Dr Servando SnareWOHL & we would like to proceed with ba pill esophagram after VQ scan to look for esophageal stricture & r/o motility issues prior to considering endoscopic intervention.  Plan: 1) Agree with lactulose for hx HE 2) BPE after VQ scan tomorrow 3) Agree w/ PPI Thanks for consult [phone]#[id number].  Electronic Signatures: Nancy [name], Nancy [name] (NP)  (Signed 20-May-14 22:21)  Authored: Brief Consult Note   Last Updated: 20-May-14 22:21 by Nancy [name], Peggye Poon [name] (NP)

## 2014-07-26 NOTE — Discharge Summary (Signed)
PATIENT NAME:  [name], Karizma E MR#:  [id number] DATE OF BIRTH:  [birth year]  DATE OF ADMISSION:  10/30/2012 DATE OF DISCHARGE:  11/07/2012   Addendum  PRIMARY CARE PHYSICIAN: Dennison MascotLemont Morrisey, MD  ADDENDUM: The patient has been waiting for nursing home placement. Her vital signs are stable. She is clinically stable and will be discharged to skilled nursing facility today. I discussed the patient's discharge plan with the patient, case manager and nurse.   TIME SPENT: About 33 minutes.   ____________________________ Shaune PollackQing Staci Dack, MD qc:OSi D: 11/07/2012 09:29:34 ET T: 11/07/2012 09:41:50 ET JOB#: [id number]  cc: Shaune PollackQing Jonatha Gagen, MD, <Dictator> Shaune PollackQING Cassadie Pankonin MD ELECTRONICALLY SIGNED 11/07/2012 13:48

## 2014-07-26 NOTE — H&P (Signed)
PATIENT NAME:  Nancy [name], Nancy [name] MR#:  [id number] DATE OF BIRTH:  1945-05-26  DATE OF ADMISSION:  02/06/2013  PRIMARY CARE PHYSICIAN: Dennison Mascot, MD  EMERGENCY ROOM PHYSICIAN: Lowella Fairy, MD  CHIEF COMPLAINT: Altered mental status.   HISTORY OF PRESENT ILLNESS: A 69 year old female patient brought in by EMS for  worsening ascites and also confusion. The patient lives at home with her daughter and has a history of cirrhosis, ascites and history of hepatic encephalopathy. The patient follows up with a hepatologist at [facility], takes lactulose every day, 30 mL q.i.d. According to the son, there has been no change recently in medication. The patient is taking medication as she is supposed to, except that she did not take lactulose this morning and the patient noted to be confused, so the son called EMS, but when I saw the patient, the patient is alert and oriented. She says that she had diarrhea 2 days ago, but no nausea, no vomiting. She thinks that it is secondary to lactulose. Denies any other complaints except left leg pain; that is chronic. The patient did not have any fever and no recent illnesses.   PAST MEDICAL HISTORY:  Significant for: 1.  History of admission this year, May 18 through 24, then May 27 to May 30.  2.  Also was admitted in June from the 8th to the 10th, and then was admitted from July 28 to August 4. The patient had multiple admissions for fluid overload or hepatic encephalopathy.  3.  Significant for COPD.  4.  History of diabetes mellitus.  5.  Chronic liver disease with cirrhosis due to multiple over-the-counter medications and herbal medications.  6.  Chronic low back pain.  7.  Diabetes mellitus, type 2.  8.  Depression.  9.  CKD, baseline creatinine 1.5.  10.  COPD. 11.  Hypertension.   PAST SURGICAL HISTORY: Cataract surgery and liver biopsy.   ALLERGIES: ASPIRIN AND TYLENOL DUE TO LIVER DISEASE.   SOCIAL HISTORY: Lives with the daughter. Recently discharged  from rehab.   FAMILY HISTORY: Father was healthy, but died in 38s due to motor vehicle accident. Mother had hypertension and Parkinson disease.   MEDICATIONS:  1.  Anusol suppository as needed for internal hemorrhoids.  2.  DuoNeb q. 6 hours p.r.n. 3.  Citalopram 20 mg daily.  4.  Furosemide 40 mg p.o. daily.  5.  Neurontin 300 mg in the morning, 300 mg at lunch and 600 mg at bedtime.  6.  Lactulose 30 mL q.i.d.  7.  Lantus 10 units at bedtime.  8.  Lorazepam 0.5 mg every 8 hours as needed for anxiety.  9.  Levothyroxine 125 mcg p.o. daily.  10.  Mirtazapine 30 mg at bedtime.  11.  Nexium 40 mg p.o. daily.  12.  NovoLog subcutaneous as needed for sliding scale.  13.  Nystatin under the abdominal folds 2 times a day. 14.  Spiriva 18 mcg inhalation daily.  15.  Sarna lotion for itching.  16.  ProAir.  17.  Oxycodone 10 mg every 6 hours as needed.  18.  Zofran 4 mg 1/2 tablet every 6 hours.  19.  Aldactone 50 mg p.o. daily.  20.  Symbicort 80/4.5 at 2 puffs b.i.d.  21.  Tramadol 50 mg p.o. daily.  22.  Triamcinolone topical 0.1% daily.  23.  Xifaxan 550 mg 1 tablet p.o. b.i.d.  24.  REVIEW OF SYSTEMS:  CONSTITUTIONAL: No fever. No fatigue. No weakness.  EYES: No blurred vision.  ENT: No tinnitus. No epistaxis. No difficulty swallowing.  RESPIRATORY: Has no cough. Does have some wheezing and history of COPD.  CARDIOVASCULAR: No chest pain. No orthopnea.  GASTROINTESTINAL: Does have abdominal bloating due to ascites, but denies any abdominal pain. No hematemesis. Does have ascites and cirrhosis.  GENITOURINARY: No dysuria.  ENDOCRINE: No polyuria or nocturia.  HEMATOLOGIC: The patient has anemia and easy bruising due to cirrhosis.  INTEGUMENTARY: Multiple bruises are present.  MUSCULOSKELETAL: Has left hip pain. Gets a steroid shot. Recently got a steroid injection and also takes tramadol.  NEUROLOGIC: Does have neuropathy.  PSYCHIATRIC: Has history of depression.   PHYSICAL  EXAMINATION:  VITAL SIGNS: Temperature 97.6, heart rate 78, blood pressure 164/56, saturations 97% on room air.  GENERAL: Alert, awake, oriented. Not in distress. Initially when seen by a physician, she was confused, but during my visit, the patient is alert, oriented and denies any complaints.  HEENT: Head atraumatic, normocephalic. Pupils equally reacting to light. Extraocular movements are intact.  EARS, NOSE, THROAT: No tympanic membrane congestion. No turbinate hypertrophy. No oropharyngeal erythema.  NECK: Normal range of motion. No JVD. No carotid bruit.  RESPIRATORY: Does have mild expiratory wheezing in all lung fields.  CARDIOVASCULAR: S1, S2 regular. No murmurs. PMI is not displaced. The patient has pedal edema and unable to feel the pedal pulses.  ABDOMEN: Soft, nontender, and distended with ascites fluid  thrill present.  mUSCULOSKELETAL: Strength 5/5 in the upper and lower extremities. The patient's gait is not tested.  SKIN: Does have a lot of bruises present and she does have a lot of itching.  NEUROLOGIC: Cranial nerves intact. DTRs 2+ bilaterally. No aphasia. Has sometimes dysarthria due to of chronic elevated ammonia levels.   PSYCHIATRIC: Oriented to time, place, person. Initially when she came, she was not oriented to place, but during my visit, she was oriented to time, place, person.   LABORATORY DATA: WBC 5.5, hemoglobin 11.1, hematocrit 33.5, platelets 102. Electrolytes: Sodium is 135. Potassium initially was 6.5, but repeat sample is showing a potassium of 4.5, chloride 112, bicarb 25, BUN 31, creatinine 1.63, glucose 133.   Chest x-ray shows no active cardiopulmonary disease.   CT head shows there are no acute intracranial findings, atrophy and microvascular changes, small lesion in the deep white matter, basal ganglia on the right side. It is new compared to previous. MRI is recommended for evaluation of pseudolesion versus metastasis.   The patient's ammonia level is  up at 97.   Extremity ultrasound shows no DVT on the left leg.   EKG shows normal sinus rhythm with 78 beats per minute. No ST-T changes.   ASSESSMENT AND PLAN:  1.  The patient is a 69 year old female patient with cirrhosis and ascites, who came in with altered mental status and hepatic encephalopathy. The patient right now is normal, now oriented to time, place, person, so we are going to continue her lactulose 30 mL q.i.d. and watch ammonia levels tomorrow.  2.  History of ascites, cirrhosis. The patient already on Aldactone, Lasix. Continue them and check daily weights.  The patient says that her ascites is the same like before. The patient will be on Lasix and Aldactone. Check clinically.  3.  Chronic obstructive pulmonary disease with slight wheezing. She is already on DuoNeb and Symbicort, along with Spiriva; continue them. Continue oxygen as needed.  4.  Possible stroke on the CT scan, but her neurological exam is within normal limits; so, MRI is ordered to further  evaluate the lesion on the CT scan, especially in the basal ganglia area.  5.  Diabetes mellitus, type 2. The patient is on sliding scale coverage with coverage only tomorrow. Lantus can be started if her p.o. intake is good.  6.  Depression. She is on Celexa.  7.  Hypothyroidism. Continue levothyroxine 125 mcg p.o. daily.  8.  Gastroesophageal reflux disease. Nexium will be given 40 mg daily.  9.  History of hip pain. She is on oxycodone every 6 hours, 10 mg as needed for left hip pain.  10.  Chronic kidney disease, stage III. Baseline creatinine is around 1.5, stable at this time. Continue to follow clinically as she is already on  Lasix and Aldactone. Unable to increase Lasix dose because of renal insufficiency. Monitor kidney function closely. Leg pain secondary to arthritis of the hip. The patient has been getting steroid injections. According to the son, the patient did have a steroid injection recently of the hip and  continue pain medications and physical therapy will be requested.   TIME SPENT: About 60 minutes on this complicated case.   ____________________________ Katha HammingSnehalatha Mehtab Dolberry, MD sk:np D: 02/06/2013 15:53:00 ET T: 02/06/2013 16:55:17 ET JOB#: [id number]  cc: Katha HammingSnehalatha Gaige Fussner, MD, <Dictator>   Katha HammingSNEHALATHA Aubrey Blackard MD ELECTRONICALLY SIGNED 03/05/2013 12:57

## 2014-07-26 NOTE — H&P (Signed)
PATIENT NAME:  Nancy [name], HUCKEBA MR#:  [id number] DATE OF BIRTH:  1945/08/04  DATE OF ADMISSION:  08/06/2012  PRIMARY CARE PHYSICIAN:  Dr. Thana Ates  CHIEF COMPLAINT:  " I feel horrible."   HISTORY OF PRESENT ILLNESS: This is a 69 year old female with multiple medical problems including liver cirrhosis, COPD on oxygen, hypertension, hyperlipidemia, diabetes, hypothyroidism, acid reflux. She presents feeling lousy, swelling in the feet, feeling weak, poor energy. She is feeling out of it. Sometimes she does have some jerking movements. The patient is not the best historian. In the ER, she was found to be bradycardic, relative hypotension, acute renal failure and an elevated ammonia level, and hospitalist services were contacted for further evaluation.   PAST MEDICAL HISTORY:  Liver cirrhosis secondary to over-the-counter medications and Tylenol, gastroesophageal reflux disease, hypothyroidism, diabetes, hyperlipidemia, hypertension, COPD on oxygen 2 liters as needed, 2 episodes of atrial fibrillation in the past.   PAST SURGICAL HISTORY:  Hysterectomy, cholecystectomy, cataracts.   ALLERGIES:  Patient allergic to ASPIRIN and TYLENOL.   SOCIAL HISTORY:  Lives alone in a senior apartment complex. No alcohol. No smoking. No drug use. Used to work as an Airline pilot.   FAMILY HISTORY:  Father died in a motor vehicle accident. Had a history of hypertension. Mother died of Parkinson's.  REVIEW OF SYSTEMS:   CONSTITUTIONAL:  Positive for sweats. Positive for weakness. No fever or chills. Positive for weight loss, then gain.  EYES:  She does have implants.  EARS, NOSE, MOUTH AND THROAT: Positive for runny nose. Positive for sore throat. Positive for dysphagia to solids.  CARDIOVASCULAR:  No chest pain.  RESPIRATORY:  Positive for shortness of breath, cough, greenish phlegm. No hemoptysis.  GASTROINTESTINAL:  Positive for diarrhea. No nausea. No vomiting. No abdominal pain. Occasional red blood seen.   GENITOURINARY:  No burning on urination. No hematuria.  MUSCULOSKELETAL:  Positive for joint pain.  INTEGUMENT:  No rashes or eruptions.  NEUROLOGIC:  She does have falls, and goes to sleep easily.  PSYCHIATRIC:  No anxiety or depression.  ENDOCRINE: No thyroid problems.  HEMATOLOGIC/LYMPHATIC:  History of anemia and thrombocytopenia.   PHYSICAL EXAMINATION: VITAL SIGNS: Temperature 98.1, pulse 52, respirations 18, blood pressure 113/53, pulse ox 99% on oxygen. Vitals when she came into the hospital included a pulse of 48, blood pressure 107/41.  GENERAL:  No respiratory distress.  EYES: Conjunctivae and lids normal. Pupils equal, round and reactive to light. Extraocular muscles intact. No nystagmus.  EARS, NOSE, MOUTH AND THROAT:  Tympanic membranes:  No erythema. Nasal mucosa: No erythema. Throat:  No erythema. No exudate seen. Lips and gums:  No lesions.  NECK: No JVD. No bruits. No lymphadenopathy. No thyromegaly. No thyroid nodules palpated.  RESPIRATORY:  Lungs clear to auscultation. No use of accessory muscles to breathe. No rhonchi, rales or wheeze heard.  CARDIOVASCULAR SYSTEM:  S1, S2 normal. No gallops, rubs or murmurs heard. Carotid upstroke 2+ bilaterally. No bruits. Dorsalis pedis pulses 2+ bilaterally. Trace edema of the lower extremities to 1+ edema of the lower extremities.  ABDOMEN:  Soft, distended. No organomegaly/splenomegaly. Normoactive bowel sounds. No masses felt. Slight tenderness to deep palpation. No tenderness when not palpated.  LYMPHATIC:  No lymph nodes in the neck.  MUSCULOSKELETAL:  No clubbing, edema or cyanosis.  SKIN:  No rashes or ulcers seen.  NEUROLOGIC:  Cranial nerves II through XII grossly intact. Deep tendon reflexes 1+ bilateral lower extremities. No tremor.  PSYCHIATRIC: The patient is alert and oriented to person  and place. Does ask the same question over again a few times.   LABORATORY AND RADIOLOGICAL DATA: Glucose 165, BUN 29, creatinine  1.78, sodium 140, potassium 3.9, chloride 110, CO2 of 23, calcium 8.5. Alkaline phosphatase 218,  ALT 24, AST 52, total protein 8.4, albumin 2.5. White blood cell count 4.8, H and H 10.1 and 30.7, platelet count of 75. INR 1.3. Troponin negative. Ammonia 65.   EKG: Marked sinus bradycardia, first-degree AV block, low voltage.   MEDICATION LIST INCLUDES:  Bisoprolol hydrochlorothiazide 10/6.25, 1 tablet daily, Celexa 20 mg daily, cyclobenzaprine 10 mg twice a day, diltiazem CD 120 mg daily, gabapentin 300 mg in the morning, 600 mg at bedtime, glimepiride 4 mg daily, lactulose 30 mL daily, Lantus 20 units at bedtime, levothyroxine 100 mcg daily, neomycin 1000 mg every 8 hours, NovoLog sliding scale, Zofran 4 mg every 8 hours as needed for nausea/vomiting, oxycodone 10 mg twice a day as needed for pain, ProAir 2 puffs every 6 hours for shortness of breath, Spiriva 1 inhalation daily, Symbicort 84.5, 2 puffs twice a day, Ambien 5 mg at bedtime.   ASSESSMENT AND PLAN: 1.  Acute renal failure and dehydration. Will give IV fluid hydration. Check creatinine again on a daily basis. Hold hydrochlorothiazide at this time.   2.  Bradycardia and hypotension. Will hold bisoprolol and Cardizem at this point. Monitor on telemetry. Obtain an echocardiogram and continue to monitor.   3.  Hepatic encephalopathy. Ammonia level borderline at 65. The patient does have a lot of diarrhea with the lactulose, only taking it once a day, but I will have to increase to twice a day right now. I will start Xifaxan also, which may limit the patient's diarrhea. Will have the care manager look into the cost of the Xifaxan.    4.  Diabetes. Will continue Lantus and sliding scale. Hold the glimepiride at this point.   5.  Hypothyroidism. Continue levothyroxine.   6.  Chronic obstructive pulmonary disease on oxygen. Respiratory status stable. Chest x-ray ordered.   7.   Gastroesophageal reflux disease. Continue omeprazole.   8.    Depression, on Celexa.   9.   Chronic pain, on oxycodone.   Time spent on admission:  55 minutes.   The patient is a FULL CODE.    ____________________________ Herschell Dimesichard J. Renae GlossWieting, MD rjw:mr D: 08/06/2012 17:51:37 ET T: 08/06/2012 18:14:53 ET JOB#: [id number]  cc: Herschell Dimesichard J. Renae GlossWieting, MD, <Dictator> Salley ScarletICHARD J Xanthe Couillard MD ELECTRONICALLY SIGNED 08/07/2012 21:33

## 2014-07-26 NOTE — Consult Note (Signed)
PATIENT NAME:  Nancy [name], Nancy [name] MR#:  [id number] DATE OF BIRTH:  20-May-1945  DATE OF CONSULTATION:  08/22/2012  REFERRING PHYSICIAN:  Srikar R. Sudini, MD CONSULTING PHYSICIAN:  Joselyn ArrowKandice L. Evoleht Hovatter, NP  PRIMARY CARE PHYSICIAN: Dennison MascotLemont Morrisey, MD   PRIMARY GASTROENTEROLOGIST: Midge Miniumarren Wohl, MD  PREVIOUS GASTROENTEROLOGIST: Lurline DelShaukat Iftikhar, MD  REASON FOR CONSULTATION: Dysphagia, history of NASH cirrhosis with hepatic encephalopathy.  HISTORY OF PRESENT ILLNESS: Ms. Nancy [name] is a 69 year old Caucasian female who has history of multiple medical conditions including COPD, chronic kidney disease, congestive heart failure, NASH cirrhosis with hepatic encephalopathy, diabetes mellitus and history of remote peptic ulcer disease. She has had recent respiratory failure and currently has a positive D-dimer and is scheduled for a VQ scan in the morning to rule out pulmonary embolus. Her platelet count is 81. She has had choking spells on everything she eats. She complains of feeling of food gets stuck in her midesophagus. She has positive both solids and liquids for at least the last 3 weeks. She has no history of esophageal dilation. Last EGD was in September 2011 by Dr. Cecelia ByarsHashmi and was normal. She does take Nexium 40 mg b.i.d. and reports her GERD is well-controlled. She is not skipping any doses. She has had a poor appetite. Denies any nausea, vomiting or odynophagia. She denies any rectal bleeding or melena. Denies any diarrhea or constipation. She does have loose stools several times per day given  lactulose for her hepatic encephalopathy. Her weight has remained stable. She denies any fever or chills. Her hemoglobin is 8.9.   PAST MEDICAL HISTORY: Remote peptic ulcer disease, NASH cirrhosis with history of hepatic encephalopathy, on lactulose. She has stage III chronic kidney disease, GERD, hypothyroidism, type 2 diabetes mellitus with retinopathy, hyperlipidemia, hypertension, oxygen-dependent COPD, CHF, atrial  fibrillation, anemia of chronic disease, chronic thrombocytopenia.   PAST SURGICAL HISTORY: She is status post hysterectomy and cholecystectomy. Last colonoscopy was September 2011 by Dr. Cecelia ByarsHashmi and was normal. She had an EGD in September 2011 by Dr. Cecelia ByarsHashmi that was normal.   MEDICATIONS PRIOR TO ADMISSION: Amlodipine 10 mg daily, citalopram 90 mg daily, cyclobenzaprine 5 mg b.i.d., gabapentin 300 mg in the morning and afternoon and 600 mg at night, glimepiride 4 mg daily, Lantus 48 units at bedtime, levothyroxine 100 mcg daily, neomycin 500 mg 2 tablets b.i.d. Monday through Friday, Nexium 40 mg b.i.d., oxycodone 10 mg q.8 hours p.r.n., ProAir HFA 90 mcg 2 puffs q.6 hours p.r.n., Spiriva 18 mcg daily, Symbicort 80 mcg/4.5 mcg 2 puffs b.i.d., zinc gluconate 50 mg daily.  ALLERGIES: ASPIRIN AND TYLENOL. THE PATIENT STATES SHE CANNOT TAKE DUE TO LIVER DISEASE.   FAMILY HISTORY: There is no known family history of  liver or chronic GI problems. She does have a family history of hypertension and Parkinson disease.  SOCIAL HISTORY: She is a widow. She denies any alcohol or illicit drug use. She is previously a Special educational needs teachertrucking manager. She has 2 healthy children. She quit smoking 14 years ago. She has a 35 pack-year history of tobacco abuse. She [facility] at [facility].   REVIEW OF SYSTEMS: See HPI.   PHYSICAL EXAMINATION:  VITAL SIGNS: 100% on 3 liters via nasal cannula.  GENERAL: She is an obese Caucasian female who is alert, pleasant, oriented and cooperative, in no acute distress.  HEENT: Sclerae clear, anicteric. Conjunctivae pink. Oropharynx pink and moist without any lesions. O2 via nasal cannula, 3 liters.  NECK: Supple without mass or thyromegaly.  HEART: Regular rate and rhythm  with normal S1 and S2 without any murmurs clicks, rubs or gallops.  LUNGS: Chronic emphysematous changes and mild expiratory wheeze bilaterally posteriorly.  ABDOMEN: Protuberant with positive bowel sounds x 4. No bruits  auscultated. Abdomen is soft,  moderately distended. No tense ascites.  EXTREMITIES: Without edema. SKIN: Pink, warm and dry with multiple ecchymoses to upper and lower extremities. PSYCHIATRIC: She is  alert, cooperative,  with normal mood and affect.   LABORATORY DATA: Glucose 102, BUN 36, creatinine 1.53, chloride 110 calcium 8.4, otherwise normal BMP. D-dimer 2.39. Fecal occult blood negative. Hemoglobin 8.9, white blood cell count 4.9, hematocrit 27, platelets 81.   IMAGING: She had a chest x-ray which showed cardiac silhouette mildly enlarged. There is no more than minimal pulmonary vascular congestion. She had a thoracic spine AP and lateral which showed partial compression of T6, which is new since July 2013 study.   IMPRESSION: Nancy [name] is a pleasant 69 year old female with chronic dysphagia to both solids and liquids. She has multiple comorbid  medical conditions including recent respiratory failure,  NASH cirrhosis with history of hepatic encephalopathy well-controlled on lactulose, chronic kidney disease, chronic obstructive pulmonary disease, congestive heart failure and diabetes mellitus, now with positive D-dimer and pending VQ scan tomorrow to rule out pulmonary embolism. Her current platelet count is 81, placing her at increased risk for bleeding with esophageal dilation. Her last EGD by Dr. Cecelia Byars was normal without stricture or varices in 2011. I discussed this case with Dr. Servando Snare and we would like to proceed with a barium pill esophagram after VQ scan to look for esophageal stricture and rule out motility issues prior to considering endoscopic intervention.   PLAN: 1.  Agree with lactulose for history of hepatic encephalopathy.  2.  Barium pill esophagram after VQ scan tomorrow.  3.  Agree with PPI.  We would like to thank you for allowing [place] to participate in the care of Nancy [name].  ____________________________ Joselyn Arrow, NP klj:jm D: 08/22/2012 22:20:49  ET T: 08/22/2012 22:35:56 ET JOB#: [id number]  cc: Joselyn Arrow, NP, <Dictator> Dennison Mascot, MD Joselyn Arrow FNP ELECTRONICALLY SIGNED 08/30/2012 17:58

## 2014-07-28 NOTE — Discharge Summary (Signed)
PATIENT NAME:  [name], [name] MR#:  [id number] DATE OF BIRTH:  06/09/1945  DATE OF ADMISSION:  07/05/2011 DATE OF DISCHARGE:  07/07/2011  ADDENDUM:  Her TSH was also slightly elevated at this time. This can be repeated as outpatient. It was 5.25. She is on 100 mcg of Synthroid at home. I will continue that for now. Also, she has cirrhosis and her AST was slightly elevated at 96 with ALT being normal. This can be followed up as outpatient.   ____________________________ Fredia SorrowAbhinav Tristin Vandeusen, MD ag:drc D: 07/07/2011 11:22:22 ET T: 07/07/2011 14:27:41 ET JOB#: [id number]  cc: Fredia SorrowAbhinav Jaylun Fleener, MD, <Dictator> Fredia SorrowABHINAV Salmaan Patchin MD ELECTRONICALLY SIGNED 07/15/2011 17:39

## 2014-07-28 NOTE — Consult Note (Signed)
PATIENT NAME:  [name], Wanda E MR#:  [id number] DATE OF BIRTH:  May 04, 1945  DATE OF CONSULTATION:  07/05/2011  REFERRING PHYSICIAN:  Alounthith Phichith, MD CONSULTING PHYSICIAN:  Haru Shaff D. Juliann Paresallwood, MD  PRIMARY CARE PHYSICIAN: Dennison MascotLemont Morrisey, MD  INDICATION: Altered mental status and atrial fibrillation with gastroenteritis.   HISTORY OF PRESENT ILLNESS: Ms. Nancy [name] is a 69 year old white female with a history of hypertension, diabetes, chronic renal insufficiency, hypothyroidism, and chronic obstructive pulmonary disease who was found to be in atrial fibrillation. She was admitted about a month ago with hyperkalemia, ATN, Escherichia coli, and pyelonephritis and found to have proximal atrial fibrillation at that time. She was treated for abdominal pain, diarrhea, and vomiting. The patient has had trouble with her mental status and continued to do poorly. She finally came to the emergency room and subsequently was admitted for further evaluation and management. She denies shortness of breath. She had a fever with weakness, fatigue, and rapid heartbeat, but with worsening symptoms she was treated medically for further evaluation and management.   REVIEW OF SYSTEMS: No blackout spells or syncope. She has had nausea and vomiting as well as low-grade fever. No chills and no sweats. No weight loss or weight gain. No hemoptysis or hematemesis. Denies bright red blood per rectum.   PAST MEDICAL HISTORY:  1. Hyperkalemia. 2. Acute tubular necrosis.  3. Hypertension. 4. Diabetes. 5. Chronic renal insufficiency.  6. Depression. 7. Hypothyroidism. 8. Chronic obstructive pulmonary disease. 9. Recurrent urinary tract infection. 10. Nephrolithiasis.  11. Neuropathy. 12. Gait disturbance.  13. Cirrhosis.  14. Bilateral cataracts.   PAST SURGICAL HISTORY:  1. Hysterectomy.  2. Cholecystectomy.  3. Benign breast lesion and excision.  4. Status post lithotripsy.   ALLERGIES: Recommended to avoid  Tylenol, aspirin, and multivitamin.   FAMILY HISTORY: Hypertension, Parkinson's disease, coronary artery disease, and stroke.   SOCIAL HISTORY: Quit smoking. No alcohol consumption. [place].   MEDICATIONS:  1. Citalopram 10 mg a day.  2. Synthroid 100 mcg a day.  3. Metformin 1000 mg twice a day. 4. Glimepiride 4 mg a day.  5. Benazepril 20 mg a day. 6. Bisoprolol/hydrochlorothiazide 10/6.25 mg daily.  7. Nexium 40 mg a day.  8. Cyclobenzaprine 5 mg twice a day.  9. Lasix 20 mg twice a day.  10. Gabapentin 100 mg in the morning, 200 mg at noon, and 200 mg at bedtime. 11. Oxycodone. 12. Symbicort 2 puffs twice a day.  13. Zolpidem 10 mg at bedtime.  14. Flomax 0.4 mg daily.  15. Spiriva daily.  16. Potassium chloride 10 mEq a day.  17. Lantus.  PHYSICAL EXAMINATION:   VITAL SIGNS: Blood pressure initially was 190/80, pulse 80, respiratory rate 18, and afebrile.   HEENT: Normocephalic, atraumatic. Pupils equal and reactive to light.   NECK: Supple. No jugular venous distention, bruits, or adenopathy.   LUNGS: Clear to auscultation and percussion. No significant wheeze, rhonchi, or rale.   HEART: Irregular, irregular rhythm. Systolic ejection murmur at left sternal border. PMI nondisplaced.   EXTREMITIES: Examination is within normal limits.   NEUROLOGIC: Examination is intact.   SKIN: Examination is normal.  LABS/STUDIES: Ammonia 51. White count 3.5, hemoglobin 10.8, hematocrit 32, and platelet count 82. BUN 16, glucose 185, creatinine 0.97, and sodium 137 LFTs: AST slightly elevated at 96, ALT 34, and alkaline phosphatase 119. Troponin was negative.   Urinalysis was unremarkable.   EKG: Atrial fibrillation, initially rapid rate but now controlled rate, nonspecific finding.  CT of  the head unremarkable.   ASSESSMENT:  1. Altered mental status. 2. Gastroenteritis. 3. Atrial fibrillation.  4. Pancytopenia.  5. Chronic obstructive pulmonary disease.   6. Diabetes.  7. Hypertension.  8. Mild increased liver function tests.    PLAN: Agree with continuing medication. Treat gastroenteritis with fluids and antiemetics. Continue to treat atrial fibrillation, rate is better. We will continue anticoagulation for now.     Continue diabetes management. Follow up renal function. Treatment for depression and anxiety should be continued. Follow up renal status for treatment. May need evaluation for pancytopenia.  Again, we will base further evaluation on the results of these studies.   ____________________________ Bobbie Stack Juliann Pares, MD ddc:slb D: 07/05/2011 15:15:06 ET T: 07/05/2011 17:08:26 ET JOB#: [id number]  cc: Edilia Ghuman D. Juliann Pares, MD, <Dictator> Alwyn Pea MD ELECTRONICALLY SIGNED 07/30/2011 15:49

## 2014-07-28 NOTE — Discharge Summary (Signed)
PATIENT NAME:  Nancy [name], [name] MR#:  [id number] DATE OF BIRTH:  16-May-1945  DATE OF ADMISSION:  07/05/2011 DATE OF DISCHARGE:  07/07/2011  ADDENDUM  At the time of discharge the patient was complaining of some fungal vaginal discharge so I gave her one dose of fluconazole 150 mg p.o. once.  ____________________________ Fredia SorrowAbhinav Jakeel Starliper, MD ag:cms D: 07/07/2011 15:18:04 ET T: 07/08/2011 10:53:50 ET JOB#: [id number]  cc: Fredia SorrowAbhinav Holli Rengel, MD, <Dictator> Dennison MascotLemont Morrisey, MD Fredia SorrowABHINAV Ricarda Atayde MD ELECTRONICALLY SIGNED 07/15/2011 17:46

## 2014-07-28 NOTE — H&P (Signed)
PATIENT NAME:  Nancy [name], [name] MR#:  [id number] DATE OF BIRTH:  [birth year]  DATE OF ADMISSION:  05/03/2011  REFERRING ER PHYSICIAN: Janalyn Harderavid Kaminski, MD  PRIMARY CARE PHYSICIAN: Dennison MascotLemont Morrisey, MD  CHIEF COMPLAINT: Fall, wheezing.   HISTORY OF PRESENT ILLNESS: The patient is a 69 year old female with past medical history of hypertension, diabetes, chronic back pain, hypothyroidism, depression, anxiety, and chronic obstructive pulmonary disease who presented from an independent living facility after she tripped and fell on a fellow residents walker. She fell on the back of her head and left side of her body and is having pain in the occipital region and left hip. She also reports that she was having progressively increasing shortness of breath and wheezing in the last two days. In route to the ED, the patient developed severe shortness of breath, wheezing, chronic obstructive pulmonary disease exacerbation. In the ED, she received two back-to-back respiratory treatments and is feeling a little bit better now.   ALLERGIES: No known drug allergies.   CURRENT MEDICATIONS:  1. Ambien 5 mg at bedtime p.r.n.  2. Zofran 4 mg p.r.n.  3. Synthroid 100 mcg daily.  4. Citalopram 40 mg daily.  5. Lantus 20 units daily.  6. Ziac 10/6.25 mg daily.  7. Metformin 1000 mg twice a day. 8. Benazepril 20 mg daily.  9. Glimepiride 4 mg daily.  10. Spiriva 1 capsule inhaled daily.  11. Nexium 40 mg daily.  12. Vitamin E 400 international units daily. 13. Benazepril p.r.n.  14. Lasix and potassium p.r.n. 15. Symbicort 1 puff twice a day. 16. Pro Air HFA 2 puffs every 6 hours p.r.n.  17. Neurontin 300 mg four times daily.  PAST MEDICAL HISTORY:  1. Hypertension. 2. Depression and anxiety. 3. Back pain. 4. Hypothyroidism. 5. Diabetes. 6. Chronic obstructive pulmonary disease. 7. Admission to [facility] in October 2012 for acute encephalopathy which was found due to medications and urinary tract  infection. 8. Questionable history of liver cirrhosis due to fatty liver. 9. History of pancytopenia related to liver cirrhosis. 10. History of unstable gait with falls.   SOCIAL HISTORY: The patient quit smoking more than 10 years ago. No history of alcohol or drug abuse.  She lives in an independent living facility.   PAST SURGICAL HISTORY:  1. Hysterectomy.  2. Cholecystectomy.   FAMILY HISTORY: Positive for hypertension, Parkinson's disease, coronary artery disease, and CVA.   PHYSICAL EXAMINATION:   VITAL SIGNS: Temperature 98.2, heart rate 65, respiratory rate 18, blood pressure 154/74, and saturation 99% on 2 liters of oxygen.   GENERAL: The patient is a 69 year old Caucasian female who is in mild distress from pain.   HEAD: Atraumatic, normocephalic.   EYES: Mild pallor. No icterus or cyanosis. Pupils are equally round and reactive to light and accommodation. Extraocular movements are intact.   ENT: Wet mucous membranes. No oropharyngeal erythema or thrush.   NECK: Supple. No masses. No JVD. No thyromegaly. No lymphadenopathy.   CHEST WALL: No tenderness to palpation. Not using accessory muscles of respiration. No intercostal muscle retractions.   LUNGS: The patient has bilateral scattered wheezing and rhonchi. No crepitus.   CVS: S1 and S2 regular. No murmur, rubs, or gallops.  ABDOMEN: Soft, nontender, and nondistended. No guarding or rigidity. No organomegaly. Normal bowel sounds.   SKIN: No rashes or lesions.   PERIPHERIES: No pedal edema. 2+ pedal pulses.   MUSCULOSKELETAL: The patient has tenderness to palpation on her left thigh and left hip posteriorly; however, she is able  to move her left lower extremity, although the movement is painful.   NEUROLOGIC: Awake, alert, and oriented x3. Nonfocal neurological exam.   PSYCH: Normal mood and affect.   RESULTS: CAT scan of the head shows no acute abnormality.   Pelvic x-ray shows no acute abnormality.   Left  femoral x-ray shows no acute abnormalities.  Chest x-ray, CBC, and BMP are currently pending.   ASSESSMENT AND PLAN: A 69 year old female with past medical history of hypertension, depression, anxiety, hypothyroidism, and diabetes who presents with a mechanical fall and chronic obstructive pulmonary disease exacerbation.  1. Chronic obstructive pulmonary disease exacerbation: Currently the patient is afebrile. Therefore, we will not start her on any antibiotics. We will treat with nebulizer treatments. Continue her Symbicort and Spiriva and start on low dose steroids.  2. Fall: The patient had a mechanical fall and is now having pain in her left buttock and thigh region. There is no evidence of fracture, as per x-rays. We will obtain a PT evaluation and provide analgesics for pain control.  3. Hypertension: We will continue the patient's current medications including Ziac and benazepril.  4. History of diabetes: We will place on a diabetic diet. Check a hemoglobin A1c. Continue Lantus, metformin, and glimepiride.  5. History of depression and anxiety: We will continue citalopram.  6. Hypothyroidism: We will continue Synthroid. 7. We will place on GI and DVT prophylaxis.   I reviewed all medical records, discussed with the ED physician, and discussed with the patient the plan of care and management.   TIME SPENT: 75 minutes.  ____________________________ Darrick Meigs, MD sp:slb D: 05/03/2011 22:58:34 ET T: 05/04/2011 07:41:58 ET JOB#: [id number]  cc: Darrick Meigs, MD, <Dictator> Dennison Mascot, MD Darrick Meigs MD ELECTRONICALLY SIGNED 05/04/2011 21:42

## 2014-07-28 NOTE — Discharge Summary (Signed)
PATIENT NAME:  Nancy [name], [name] MR#:  [id number] DATE OF BIRTH:  1945/04/26  DATE OF ADMISSION:  10/13/2011 DATE OF DISCHARGE:  10/14/2011  ADMISSION DIAGNOSIS: Hepatic encephalopathy.   DISCHARGE DIAGNOSES:  1. Hepatic encephalopathy.  2. Acute renal failure.  3. Hyperkalemia.  4. Diabetes.  5. Gastroesophageal reflux disease.  6. History of chronic obstructive pulmonary disease.  7. History of hypothyroidism.  8. History of hypertension.  9. Diabetic neuropathy.  10. Chronic liver disease.   DISCHARGE LABS: Sodium 137, potassium 4.7, chloride 105, bicarbonate 25, BUN 23, creatinine 1.38, and glucose 242. Total protein 8.5, albumin 2.7, alkaline phosphatase 145, and bilirubin 0.6.   White blood cells 3.7, hemoglobin 10, hematocrit 32, and platelets 83.   Urinalysis showed 1+ leukocyte esterase and 3+ bacteria.   HOSPITAL COURSE: The patient is a 69 year old female who is on lactulose for her liver disease who was not taking it as prescribed and came in with hepatic encephalopathy. For further details, please refer to Dr. Becky SaxVivek Sainani's history and physical.  1. Hepatic encephalopathy. The patient's mental status is now improved. She is now back to her baseline. She will resume her Lactulose and Flagyl. Her ammonia level is better than on admission.  2. Acute renal failure due to dehydration. Some of her medications were held. She may restart these if as her creatinine is improved.  3. Hyperkalemia due to acute renal failure, which has resolved.  4. Diabetes. The patient will resume her outpatient medications.  5. Gastroesophageal reflux disease. On Nexium.  6. History of chronic obstructive pulmonary disease, which is stable.  7. Hypothyroidism. On Synthroid.  8. Hypertension, hemodynamically stable. The patient will resume her outpatient medications.  9. Diabetic neuropathy. On Neurontin.  10. Chronic liver disease from medications. [facility]. The patient has a  follow up on Monday with her  physician.   DISCHARGE MEDICATIONS:  1. Diltiazem 120 mg daily.  2. Glimepiride 4 mg daily.  3. Citalopram 20 mg daily.  4. Bisoprolol/HCTZ 10/6.25 mg daily.  5. Symbicort 2 puffs twice a day. 6. Spiriva 18 mcg daily.  7. Benazepril 20 mg daily.  8. Flexeril 10 mg twice a day. 9. Gabapentin 300 mg 1 tablet in the morning and evening and two at bedtime.  10. Synthroid 25 mcg daily.  11. Lasix 20 mg daily.  12. Oxycodone 10 mg twice a day p.r.n.  13. Spironolactone 25 mg daily.  14. Ambien 10 mg at bedtime p.r.n.  15. ProAir HFA 2 puffs as needed.  16. Nexium 40 mg daily.  17. Flagyl 250 mg p.o. three times daily. 18. NovoLog sliding scale.  19. Lantus 100 units, 20 units at bedtime.  20. Ciprofloxacin 250 mg 1 tablet every 12 hours for five days.   DISCHARGE DIET: Low sodium.  DISCHARGE ACTIVITY: As tolerated.        DISCHARGE FOLLOWUP: The patient will follow up with her GI [facility] on Monday.   TIME SPENT: Approximately 35 minutes.  ____________________________ Nancy ContesSital P. Juliene PinaMody, [name] spm:slb D: 10/14/2011 13:32:20 ET T: 10/14/2011 15:13:15 ET JOB#: [id number]  cc: Nancy [name] P. Juliene PinaMody, [name], <Dictator> Nancy [name] ELECTRONICALLY SIGNED 10/15/2011 13:19

## 2014-07-28 NOTE — H&P (Signed)
PATIENT NAME:  Nancy [name], Nancy [name] MR#:  [id number] DATE OF BIRTH:  1945-12-30  DATE OF ADMISSION:  07/18/2011  PRIMARY CARE PHYSICIAN: Laruth Bouchardamika Lott, MD at [facility]   CHIEF COMPLAINT: Cough with green phlegm and shortness of breath.   HISTORY OF PRESENT ILLNESS: Ms. Nancy [name] is a 69 year old Caucasian female with multiple medical problems, presents to the Emergency Room after she started having increasing shortness of breath with cough and rhonchi. She saw her primary care physician, Dr. Lazarus SalinesLott, who started her on several new medications, including doxycycline and prednisone. The patient continued to wheeze, was not feeling well, with chest tightness, and came to the Emergency Room where she remained hemodynamically stable with saturations around 96% on 2 liters and off oxygen was around 93%. The patient received IV Solu-Medrol 60 mg x1 and a breathing treatment. She continues to feel very tight. She is being admitted for chronic obstructive pulmonary disease exacerbation and acute bronchitis.   PAST MEDICAL/SURGICAL HISTORY:  1. History of cirrhosis of liver, etiology unclear, likely fatty liver, and per patient it is related to medication side effects.  2. Hyperlipidemia.  3. Hypertension.  4. Peptic ulcer disease.  5. Kidney stones.  6. Splenomegaly.  7. Diabetes.  8. Chronic obstructive pulmonary disease, ex-smoker. 9. Total hysterectomy.  10. Appendectomy.  11. Cholecystectomy.  12. Paroxysmal atrial fibrillation during last admission in April of 2013 when she was admitted with altered mental status. She was started on Cardizem at that time.  13. History of Campylobacter gastroenteritis.  14. Chronic back pain.  15. Pancytopenia secondary to cirrhosis.  16. Peripheral neuropathy, on gabapentin.  MEDICATIONS: According to the Discharge Summary of 07/07/2011: 1. Lantus 20 units subcutaneous daily.  2. Citalopram 20 mg daily.  3. Metformin 1000 mg b.i.d.  4. Glimepiride 4 mg p.o.  daily.  5. Benazepril 20 mg p.o. daily.  6. Bisoprolol hydrochlorothiazide 10/6.25 p.o. daily.  7. Nexium 40 mg daily.  8. Cyclobenzaprine 5 mg b.i.d.  9. Gabapentin 100 mg, 2 tablets t.i.d.  10. Levothyroxine 100 mcg p.o. daily.  11. Oxycodone 5 mg every 6 hours as needed.  12. Symbicort 160/4.5, 2 puffs b.i.d.  13. Zolpidem 10 mg orally once a day as needed.  14. Spiriva 18 mcg inhalation, 1 capsule inhalation daily.  15. Cardizem CD 120 mg p.o. daily.  16. Lactulose 30 mL twice a day.  17. Aspirin 81 mg daily.  18. Lasix 20 mg as needed.  19. Magnesium oxide was only for 2 days.  The patient went to Dr. Laruth Bouchardamika Lott yesterday.  She was started on some new medications which are apart from the medications above. She was started on:  1. Spironolactone 25 mg daily.  2. Benzonatate 100 mg t.i.d.  3. Lisinopril/hydrochlorothiazide 10/12.5 mg p.o. daily.  4. Lasix 20 mg daily.  5. Prednisone taper.  6. Doxycycline 100 mg b.i.d.   SOCIAL HISTORY: She quit smoking in 2002. No alcohol or drug use. She lives in [place] in an independent facility alone.   FAMILY HISTORY: Hypertension, Parkinson's, coronary artery disease, stroke.   ALLERGIES: No known drug allergies. She has been recommended to avoid over-the-counter medicines with Tylenol, aspirin, vitamin supplements due to her liver disease.   REVIEW OF SYSTEMS: CONSTITUTIONAL: No fever. Positive for fatigue, weakness. EYES: No blurred or double vision. ENT: No tinnitus, ear pain, hearing loss. RESPIRATORY: Positive for cough, wheeze, chronic obstructive pulmonary disease. CARDIOVASCULAR: No chest pain. Positive for chest tightness. No orthopnea, edema or hypertension. GI: No  nausea, vomiting, diarrhea, or abdominal pain. GU: No dysuria or hematuria. ENDOCRINE: No polyuria or nocturia. HEMATOLOGY: No anemia or easy bruising. SKIN: No acne or rash. MUSCULOSKELETAL: Positive for arthritis. NEUROLOGICAL: No cerebrovascular accident or  transient ischemic attack. PSYCHIATRIC: No anxiety or depression. All other systems reviewed and negative.   PHYSICAL EXAMINATION:  GENERAL: The patient is awake, alert and oriented x3, not in acute distress.   VITAL SIGNS: Afebrile, pulse  is 72, blood pressure is 172/63, sats are 95% on 2 liters.   HEENT: Atraumatic, normocephalic. Pupils are equal, round, and reactive to light and accommodation. Extraocular movements are intact. Oral mucosa is moist.   NECK: Supple. No JVD. No carotid bruit.   RESPIRATORY: There are expiratory rhonchi present. No labored breathing. No use of accessory muscles. No crackles heard.   CARDIOVASCULAR: Both the heart sounds are normal. Rhythm is sinus. Rate is regular. No murmur heard. PMI is not lateralized.   CHEST: Nontender.   EXTREMITIES: Good pedal pulses, good femoral pulses. Trace lower extremity edema.   ABDOMEN: Soft, benign, and nontender. No organomegaly. Positive bowel sounds.   NEUROLOGICAL: Grossly intact cranial nerves II through XII. No motor deficits.   LABORATORY, DIAGNOSTIC AND RADIOLOGICAL DATA:  EKG shows sinus rhythm.  Chest x-ray: No pneumonia noted. No infiltrate.  Cardiac enzymes first set negative.  White count 3.4, hemoglobin and hematocrit 11.3 and 34.3, platelet count is 83, MCV is 89. Glucose 378, BUN is 27, creatinine is 1.57, sodium is 132, potassium 4, chloride 99, alkaline phosphatase 176.  LFTs:  SGPT, SGOT normal and albumin is 2.6.  B-type natriuretic peptide is 25.   ASSESSMENT: Nancy [name] is a 69 year old with:  1. Acute bronchitis with chronic obstructive pulmonary disease exacerbation. The patient presented with cough with phlegm and rhonchi. She was started outpatient with prednisone and doxycycline yesterday.  2. Cirrhosis of liver, appears fatty liver, unclear etiology.  3. Ex-smoker.  4. Paroxysmal atrial fibrillation, now in sinus rhythm. The patient during last admission in first week of April 2013 was  discharged on Cardizem CD.  5. Type 2 diabetes.  6. Hypertension.   PLAN:  1. Admit the patient for overnight observation.  2. I will continue all the home medications from previous discharge.  3. IV Solu-Medrol 60 mg b.i.d.  4. Continue p.o. doxycycline 100 mg b.i.d. Give nebulizer, oral  inhalers and Tessalon Perles. 5. I will hold off on Cardizem, spironolactone, lisinopril/hydrochlorothiazide and Lasix because it seems the patient is getting too many medications and also medications from the same group of drugs.   6. We will check metabolic panel in the morning.  7. Sputum culture if the patient is able to produce sputum.  8. I will hold off on any on antiplatelet agent given pancytopenia and low platelets due to cirrhosis of liver.  9. Further work-up according to the patient's clinical course.   The hospital admission plan was discussed with the patient, who is agreeable to it. No family member was present.  CODE STATUS:  The patient is a FULL CODE.        TIME SPENT:  45 minutes.  ____________________________ Wylie Hail Allena Katz, MD sap:cbb D: 07/17/2011 20:29:29 ET T: 07/18/2011 08:02:51 ET JOB#: [id number]  cc: Kahlel Peake A. Allena Katz, MD, <Dictator> Tamika J. Lazarus Salines, MD Willow Ora MD ELECTRONICALLY SIGNED 07/26/2011 13:20

## 2014-07-28 NOTE — Discharge Summary (Signed)
PATIENT NAME:  Nancy [name], Nancy [name] MR#:  [id number] DATE OF BIRTH:  01/20/1946  DATE OF ADMISSION:  05/03/2011 DATE OF DISCHARGE:  05/05/2011  DISCHARGE DIAGNOSES:  1. Chronic obstructive pulmonary disease exacerbation.  2. Acute renal failure on chronic kidney disease.  3. Recurrent urinary tract infections.  4. Hypertension.  5. Diabetes mellitus type 2.  6. Hypothyroidism.  7. Neuropathy and chronic pain.   DISCHARGE MEDICATIONS:  1. Celexa 10 mg p.o. daily.  2. Metformin 1 gram p.o. b.i.d.  3. Levothyroxine 100 mcg p.o. daily.  4. KCL 10 mEq p.o. b.i.d.  5. Gabapentin 300 mg p.o. t.i.d.  6. Amaryl 4 mg p.o. daily.  7. Ambien 10 mg p.o. daily. 8. Benazepril  20 mg p.o. daily.  9. Symbicort. 10. Bisoprolol with HCTZ 10/6.25 mg p.o. daily.  11. Nexium 40 mg p.o. daily.  12. Cyclobenzaprine 5 mg p.o. b.i.d.  13. Oxycodone 5 mg every six hours as needed.  14. Prednisone Dosepak.   LABORATORY, DIAGNOSTIC AND RADIOLOGICAL DATA: Patient's urine cultures from primary doctor which was done on 01/24 were obtained which showed Escherichia coli urinary tract infection resistant to all antibiotics except Augmentin and IV ertapenem  and imipenem and also Bactrim. Patient was given Bactrim by primary doctor, but because of renal insufficiency I changed it to Augmentin for 10 days and follow up with Dr. Leonette MonarchHarman, the urologist, in 10 days maybe to repeat urine cultures and also follow up on her recurrent kidney stones.   DISPOSITION: Home.   CONSULTATIONS: None.   HOSPITAL COURSE: This is a 69 year old female of Dr. Dennison MascotLemont Morrisey came in with trouble breathing. Patient also had a fall at the independent living facility. Look at the history and physical for full details. Patient is admitted for:  1. Chronic obstructive pulmonary disease exacerbation and also fall. Patient's chest x-ray did not show any pneumonia but had some wheezing on admission so she was started on steroids and because she does  not have any white count or fevers she is not given any antibiotic but her saturations initially were 99% on 2 liters. Today patient is off oxygen, saturations are 98%. Her lungs are much better and she is not using accessory muscles of respiration and afebrile so she wanted to go home. Will discharge her with p.o. steroids and advised to continue her Symbicort and Spiriva and steroid taper dose was given and she does have nebulizer that she can take. Patient is given albuterol inhaler while she is here and advised to continue the same.  2. Patient did have fall at independent living facility and has history of unstable gait and falls. Patient had x-rays of the hip. Left femur x-ray showed no acute fracture. Pelvic x-ray did not show any fractures. CT of the head showed no acute abnormality because she fell on her head.  3. Recurrent urinary tract infections. Patient did history of recurrent urinary tract infections and sees Dr. Leonette MonarchHarman. I obtained the urine culture report from the office. She is given Bactrim because of Escherichia coli resistant to Cipro and that is changed and she took three doses of Bactrim before coming in here and the culture results shows it is sensitive to Augmentin and Bactrim and other IV antibiotics so I gave a prescription for Augmentin and advised her to follow up with her urologist.   4. Patient has history of diabetes mellitus type 2. Her sugars are elevated because of steroids. She is advised to continue her home medications and see Dr.  [employer].  5. Hypertension. Her blood pressure has been around 154/70 and 133/70 and she actually has been on Lasix . Advised her to stop the Lasix because of dehydration and mild acute renal insufficiency but she can continue her bisoprolol with HCTZ 6. History hypothyroidism. She is continued on Synthroid 100 mcg daily. 7. Chronic pain in the back. She is on oxycodone. Wrote a prescription for oxycodone, only 30 tablets.  8. Patient has  mild acute renal insufficiency and chronic kidney disease. Patient had an intravenous pyelogram on 01/24 and she was off metformin for three days, however, on 01/24 kidney function showed creatinine 1.12 but GFR is slightly low at 52. During this hospitalization creatinine is around 1.44 and BUN 19 on admission with GFR of 39. I don't know if it is related to contrast that was given few days before but anyway she is restarted on metformin before she came here and her kidney function stayed around creatinine of 1.64 and GFR around 33 so I told her to stop the Bactrim and also continue the metformin as she was taking and she also needs repeat kidney function done by urologist in one week to follow up on the creatinine and BUN and decide whether she needs to be off the metformin along with other interventions.   CONDITION ON DISCHARGE: Condition is stable. She went home with her son.   TIME SPENT ON PREPARATION: More than 30 minutes.  ____________________________ Katha Hamming, MD sk:cms D: 05/05/2011 13:47:49 ET T: 05/05/2011 16:26:56 ET JOB#: [id number]  cc: Katha Hamming, MD, <Dictator> Katha Hamming MD ELECTRONICALLY SIGNED 05/24/2011 16:19

## 2014-07-28 NOTE — Discharge Summary (Signed)
PATIENT NAME:  [name], Nancy [name] DATE OF BIRTH:  1945-09-13  DATE OF ADMISSION:  09/18/2011 DATE OF DISCHARGE:  09/19/2011  PRESENTING COMPLAINT: Altered mental status.   DISCHARGE DIAGNOSES:  1. Hepatic encephalopathy, mild, resolved.  2. Altered mental status, improved.  3. Hypertension.  4. Type 2 diabetes.  5. Chronic cirrhosis of liver.  DISCHARGE MEDICATIONS: 1. Ambien 10 mg at bedtime.  2. Zofran 4 mg every eight hours as needed.  3. Neurontin 300 mg b.i.d.  4. Gabapentin 600 mg at bedtime.  5. Diltiazem CD 120 mg p.o. extended release daily.  6. Flexeril 5 mg b.i.d.  7. Benazepril 10 mg daily.  8. Levothyroxine 100 mcg 1 capsule daily.  9. Lactulose 30 mL twice a day for now then daily according to symptoms of diarrhea.  10. Glimepiride 4 mg daily.  11. Flagyl 250 mg 3 times a day.  12. Citalopram 20 mg daily.  13. Oxycodone 5 mg twice a day.  14. Spironolactone 50 mg daily.  15. Metformin 1000 mg b.i.d.  16. Bisoprolol/hydrochlorothiazide 10/6.25 p.o. daily.  17. Symbicort 80/4.5 mcg per inhalation, 2 puffs b.i.d. 18. Spiriva 18-mcg inhalation daily.  19. K-Dur 10 mEq daily p.r.n. with Lasix.  20. Lasix 20 mg daily p.r.n. for leg swelling.   FOLLOWUP: Follow up with Dr. Thana AtesMorrisey in 1 to 2 weeks.   LABORATORY DATA:   Glucose 248, creatinine 1.26, sodium 136, potassium 4.6, chloride 103. PT-INR 16.7-1.3. Ammonia 26. Serum ammonia on admission was 84. CT of the head without contrast showed no acute intracranial abnormality. Creatinine on admission was 1.75.   BRIEF SUMMARY OF HOSPITAL COURSE: Nancy [name] is a 69 year old Caucasian female with history of chronic cirrhosis of the liver who came to the Emergency Room with:  1. Altered mental status suspected from mild hepatic encephalopathy with elevated ammonia level of 84. The patient apparently had stopped taking her lactulose since it was giving her a lot of diarrheal stools. She was started on IV fluids and  started back on lactulose. Her ammonia level normalized to 26 prior to discharge.  Her mentation improved and she was back at her baseline. She is recommended to take lactulose for now twice a day for the next couple of days and then daily according to her diarrheal symptoms. She will follow up with Dr. Niel HummerIftikhar on an as-needed basis.  2. Mild dehydration. Improved after IV hydration. The patient's creatinine on admission was 1.75. At discharge it was 1.26.  3. Type 2 diabetes. Her home medications were continued. Metformin was held given elevated creatinine. However, it was resumed at discharge.  4. Hypertension. Her home medications were continued.  5. Chronic cirrhosis of liver. The patient follows up with Dr. Niel HummerIftikhar.  6. Hospital stay otherwise remained stable.  7. CODE STATUS: The patient remained a FULL CODE.    TIME SPENT: 40 minutes.  ____________________________ Wylie HailSona A. Allena KatzPatel, MD sap:bjt D: 09/19/2011 12:06:54 ET T: 09/20/2011 10:42:04 ET JOB#: [id number]  cc: Sehaj Kolden A. Allena KatzPatel, MD, <Dictator> Lurline DelShaukat Iftikhar, MD Dennison MascotLemont Morrisey, MD Willow OraSONA A Ionia Schey MD ELECTRONICALLY SIGNED 09/25/2011 15:44

## 2014-07-28 NOTE — Consult Note (Signed)
Brief Consult Note: Diagnosis: Encephalopathy.   Patient was seen by consultant.   Comments: Hepatic encephalopathy due to poor compliance with lactulose and Xiafaxan. Diarrhea, most likely secondary to lactulose but need to r/o C. diff. Chronic liver disease.  Recommendations; C. diff toxin. Agree with Neomycin. May reduce lactulose to once or twice a day. Will follow.  Electronic Signatures: Lurline DelIftikhar, Alexx Giambra (MD)  (Signed 13-Jul-13 14:22)  Authored: Brief Consult Note   Last Updated: 13-Jul-13 14:22 by Lurline DelIftikhar, Viktor Philipp (MD)

## 2014-07-28 NOTE — Discharge Summary (Signed)
PATIENT NAME:  Nancy [name], Nancy [name] MR#:  [id number] DATE OF BIRTH:  [birth year]  DATE OF ADMISSION:  06/03/2011 DATE OF DISCHARGE:  06/08/2011  PRIMARY CARE PHYSICIAN: Dennison MascotLemont Morrisey, MD    DISCHARGE DIAGNOSES:  1. Hyperkalemia, status post Kayexalate, now resolved.  2. Acute Escherichia coli pyelonephritis not improving with antibiotic.  3. Acute on chronic kidney disease with a creatinine of 0.96 before discharge, close to baseline.  4. Chronic obstructive pulmonary disease, stable.  5. Ambulatory dysfunction with physical therapy recommending rehabilitation, but the patient is refusing.  6. Diabetes mellitus, resuming home medication. The patient was hypoglycemic on admission but now sugars have been going up.  7. Anxiety, on Celexa.  8. Transient atrial fibrillation with rapid ventricular rate, now back in normal sinus rhythm.  9. Hypomagnesemia, repleted and resolved.   SECONDARY DIAGNOSES:  1. Hypertension.  2. Diabetes.  3. Chronic kidney disease, stage III.  4. Depression/anxiety.  5. Hypothyroidism.  6. Chronic obstructive pulmonary disease.  7. Unstable gait with recurrent falls.  8. History of fatty liver and cirrhosis.   CONSULTATION:  1. Physical Therapy.  2. Nephrology, Dr. Thedore MinsSingh.   LABORATORY, DIAGNOSTIC AND RADIOLOGICAL DATA:  Chest x-ray on the 28th of February showed no acute cardiopulmonary disease.  Lumbar spine x-ray on the 28th of February showed no acute vertebral body fracture. Narrowing of L5-S1 consistent with disk disease. Atherosclerotic calcifications in the abdominal aorta. Possible left-sided nephrolithiasis.  Chest x-ray on the 3rd of March showed no acute cardiopulmonary disease.  A 2-D echocardiogram on the 4th of March showed normal LV size, no thrombus. Ejection fraction more than 55%. Normal LV systolic function.  Urinalysis on admission on the 28th of February showed 89 WBCs, 1+ bacteria, 2+ leukocyte esterase, positive nitrite.  Repeat urinalysis  on the 1st of March was negative.  Blood cultures x2 were negative.  Urine culture grew more than 100,000 colonies of Escherichia coli.  Serum protein electrophoresis showed albumin of 3.1, gamma globulin of 2.1, which was elevated.  Serum vitamin B12 level was within normal limits with a value of 276.  Urine protein electrophoresis showed elevated total protein with a value of 23.8.  Free kappa and lambda chain was elevated with a value of 80.44 and 88.03, respectively.   HISTORY AND SHORT HOSPITAL COURSE: The patient is a 69 year old female with the above-mentioned medical problems, was admitted for recurrent falls and generalized weakness. She was found to be hyperkalemic, which was thought to be iatrogenic as she was on potassium supplement. Physical Therapy consultation was obtained and was recommended rehab placement, although the patient did not agree with the same. The patient was also found to have Escherichia coli urinary tract infection/pyelonephritis and was treated with IV Zosyn and subsequently oral equivalent. She was evaluated by Nephrology, Dr. Thedore MinsSingh, considering acute renal failure and hyperkalemia. Her admission creatinine was 1.79 with a potassium of 7.1. Please see Dr. Suzanne BoronKonidena's dictated History and Physical for further details. Her acute renal failure was thought to be possibly due to ATN, nonoliguric. Dr. Thedore MinsSingh followed through the course while in the hospital, and the patient's kidney function was slowly improving. Her creatinine on the 4th of March was 0.96, close to baseline. The patient continued to have minimal flank pain which was slowly improving. She was changed from Zosyn to Keflex. She also had transient atrial fibrillation, which was converted back into normal sinus rhythm while in the hospital, which was thought to be possibly due to pain and/or anxiety.  On the 5th of March, she was doing much better and was stable enough to be discharged. The patient had some vaginal  itching for which she was given one dose of Diflucan for possible uncomplicated vulvovaginitis. The patient refused to go to rehab and kept asking for going home only; and Care Management had a long discussion with her, and she was set up to get Home Health just to go home on.   PERTINENT PHYSICAL EXAMINATION: On the date of discharge her vital signs were as follows: Temperature 97.9, heart rate 64 per minute, respirations 20 per minute, blood pressure 154/65 mmHg. She was saturating 96% on room air. CARDIOVASCULAR: S1, S2 normal. No murmur, rubs, or gallop. LUNGS: Clear to auscultation bilaterally. No wheezing, rales, rhonchi, or crepitation. ABDOMEN: Soft, benign. NEUROLOGICAL: Nonfocal examination. All other physical examination remained at baseline.   DISCHARGE MEDICATIONS:  1. Citalopram 10 mg p.o. daily.  2. Metformin 1000 mg p.o. b.i.d.  3. Glimepiride 4 mg p.o. daily.  4. Benazepril 20 mg p.o. daily.  5. Bisoprolol/hydrochlorothiazide 10/6.25 mg, 1 tablet p.o. daily.  6. Nexium 40 mg p.o. daily.  7. Cyclobenzaprine 5 mg p.o. b.i.d.  8. Lasix 20 mg p.o. b.i.d.  9. Gabapentin 100 mg p.o. every morning, and 2 capsules at noon, and 2 capsules at bedtime. Levothyroxine 100 mcg p.o. daily.  10. Oxycodone 5 mg p.o. every 6 hours as needed.  11. Symbicort 2 puffs inhaled b.i.d.   12. Zolpidem 10 mg p.o. at bedtime as needed.  13. Flomax 0.4 mg p.o. daily. 14. Spiriva once daily.  15. Potassium chloride 10 mEq p.o. daily.  16. Keflex 500 mg p.o. every 8 hours for 2 days.  17. Insulin Lantus 20 units subcutaneous at bedtime.   DISCHARGE DIET: Low sodium, 1800 ADA.   DISCHARGE ACTIVITY: As tolerated.   DISCHARGE INSTRUCTIONS AND FOLLOWUP:   1. The patient was instructed to follow up with her primary care physician, Dr. Dennison Mascot, in 1 to 2 weeks.  2. She is set up to get Home Health with Physical Therapy and Nursing.      TOTAL TIME DISCHARGING THIS PATIENT: 55 minutes.    ____________________________ Ellamae Sia. Sherryll Burger, MD vss:cbb D: 06/11/2011 12:05:38 ET T: 06/11/2011 12:23:56 ET JOB#: [id number]  cc: Grae Leathers S. Sherryll Burger, MD, <Dictator> Dennison Mascot, MD Laverda Sorenson, MD Ellamae Sia [facility] MD ELECTRONICALLY SIGNED 06/12/2011 13:15

## 2014-07-28 NOTE — H&P (Signed)
PATIENT NAME:  [name], [name] MR#:  [id number] DATE OF BIRTH:  Jul 05, 1945  DATE OF ADMISSION:  06/03/2011  PRIMARY CARE PHYSICIAN: Dr. Dennison MascotLemont Morrisey  ER PHYSICIAN: Dr. Gaetano NetKevin Boland   CHIEF COMPLAINT: Fall.  HISTORY OF PRESENT ILLNESS: 69 year old female with history of hypertension, diabetes, chronic kidney disease stage III, and history of nephrolithiasis came in because of the falling. Patient lives at an independent living facility and had a fall at home today. Patient fell down 2 to 3 times today and also had a fall last Friday. She had shock wave lithotripsy for her left kidney stone last Friday and after that she says she has pain  around ESwl site and has been falling. She says that she feels very weak and has no strength in her legs and they just give out. Did not hit her head, just fall. She fell on her back. Did not loose any consciousness. Did not feel dizzy. No chest pain. Patient denies any seizure activity. No nausea. No vomiting. No diarrhea. Because of recurrent falls the patient came in here.   PAST MEDICAL HISTORY:  1. Hypertension.  2. Diabetes. 3. Chronic kidney disease, stage III. 4. Depression, anxiety. 5. Hypothyroidism. 6. Chronic obstructive pulmonary disease.  7. Patient also was admitted on 01/28 and was discharged on 01/30 recently for chronic obstructive pulmonary disease flare.  8. She also has a history of unstable gait with falls.  9. 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   PAST SURGICAL HISTORY:  1. Hysterectomy. 2. Cholecystectomy.  3. Patient recently had a lithotripsy for her left-sided nephrolithiasis by Dr. Orson SlickHarmon.  SOCIAL HISTORY: Quit smoking 10 years ago. No alcohol. Lives in independent facility.    FAMILY HISTORY: Significant for hypertension, Parkinson disease, coronary artery disease.   ALLERGIES: No known drug allergies.   MEDICATIONS: 1. Synthroid 100 mcg daily.  2. Celexa 40 mg  daily. 3. Lantus 20 units b.i.d.  4. Ziac 10/6.25 mg daily. 5. Metformin 1 gram p.o. b.i.d.  6. Glimepiride 4 mg daily. 7. Spiriva 1 capsule inhalation daily.  8. Nexium 40 mg p.o. daily.  9. She also was taking Lasix 20 mg b.i.d. but her Lasix is stopped. 10. Symbicort. She takes 1 puff b.i.d.  11. ProAir 2 puffs every six hours p.r.n.  12. Neurontin 300 mg b.i.d.  13. Flomax 0.4 mg daily.   REVIEW OF SYSTEMS: CONSTITUTIONAL: Complains of fatigue and weakness and pain in the groin area on the left side. EYES: No blurred vision. ENT: No tinnitus. No epistaxis. No difficulty swallowing. RESPIRATORY: She had some wheezing last night and used the breathing treatments. Denies any trouble breathing today. CARDIOVASCULAR: No chest pain. No orthopnea. No palpitations. GASTROINTESTINAL: Has no nausea, no vomiting, no abdominal pain. GENITOURINARY: Patient denies any dysuria. Has history of recurrent urinary tract infections before and also nephrolithiasis. ENDOCRINE: Has diabetes and diabetic neuropathy. She also has hypothyroidism. Denies any increased sweating or polyuria. INTEGUMENT: Patient has no skin rashes. MUSCULOSKELETAL: Has no joint pains at this time. She has history of back pain but denies any active pains at this time. NEUROLOGIC: Patient has no numbness. Generalized weakness present and falls. PSYCH: Has history of depression and anxiety.   PHYSICAL EXAMINATION:  VITAL SIGNS: Blood pressure 134/51, pulse 66, respirations 20, sats 97% on room air.    GENERAL: She is alert, awake, oriented.   HEENT: Head atraumatic, normocephalic. Pupils are equally reacting to light. Extraocular movements are intact.  ENT: No tympanic membrane congestion. No turbinate hypertrophy. No oropharyngeal erythema.   NECK: Normal range of motion. No JVD. No carotid bruit.  RESPIRATORY: Bilateral breath sounds present. No wheeze. No rales. Not using accessory muscles of inspiration.   CARDIOVASCULAR: S1, S2  regular. PMI not displaced. Good pedal pulses and femoral pulses. No extremity edema.   ABDOMEN: Soft, nontender, nondistended. Bowel sounds present. patient has small spot on the left groin in the kidney status post ESWL.   MUSCULOSKELETAL: Power is good bilaterally.    SKIN: No skin rashes.    NEUROLOGIC: Cranial nerves intact. Deep tendon reflexes 2+ bilaterally. No dysarthria.    PSYCH: Oriented to time, place, person; looked slightly anxious.   LABORATORY, DIAGNOSTIC AND RADIOLOGICAL DATA: Troponin less than 0.02. WBC 3.3, hemoglobin 10.9, hematocrit 33, platelets 139. Electrolytes: Sodium 137, potassium 7.1, chloride 106, bicarbonate 18, BUN 23, creatinine 1.79, glucose 71.   Liver functions: ALT 22, alkaline phosphatase 151, albumin 3.   Chest x-ray showed no acute changes are identified. Lumbar spine x-ray showed no acute vertebral fractures. Patient has a deformity in L5 which is unchanged from before and has narrowing at L5-S1 with intervertebral disk space consistent with disk disease. Atherosclerotic calcifications are present in the aorta and has calcification present throughout the kidney consistent with left nephrolithiasis.   Urine is yellow cloudy urine with 2+ leukocyte and nitrates and 1+ bacteria. Potassium repeat showed 7.2 and third one showed 6.7. She received calcium gluconate with insulin and dextrose. EKG did not show any tall T-waves. It showed normal sinus rhythm with 60 beats per minute.   ASSESSMENT AND PLAN:  1. This is a 69 year old female with generalized weakness and multiple falls. Patient has multiple medical problems and generalized weakness may be coming from her hyperkalemia. Patient will be admitted to telemetry. She has no EKG changes. She already received insulin along with Kayexalate and D50. Patient also received calcium gluconate. We are going to admit her on telemetry and repeat the potassium again around 6:00 p.m. and treat it accordingly.   2. Patient has urinary tract infection. Has history of urinary tract infections and had history of Klebsiella and Escherichia coli urinary tract infection. We are going to give her Zosyn along with following the urine cultures and adjust antibiotics as needed.  3. Nephrolithiasis status post ESWL.  4. Chronic kidney disease stage III due to diabetes mellitus, hypertension and she is getting Ziac. . Will hold HCTZ part of Ziac along with Benazepril because of her renal insufficiency. Patient will be monitored closely for her kidney problem and will get a nephrology consult.  5. Hyperkalemia. Probably coming from her potassium. She was off Lasix but she is still taking potassium. 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  6. Chronic obstructive pulmonary disease. No wheezing at this time. Continue her Symbicort and Spiriva.  7. History of neuropathy. She is on Neurontin, continue that.  8. Synthroid will be continued for her hypothyroidism. 9. Generalized weakness with recurrent falls. Will get physical therapy involved.  Discussed the plan with patient and patient's daughter.   TIME SPENT ON HISTORY AND PHYSICAL: About 60 minutes.  ____________________________ Katha Hamming, MD sk:cms D: 06/03/2011 16:48:35 ET T: 06/03/2011 17:21:40 ET JOB#: [id number]  cc: Katha Hamming, MD, <Dictator> Dennison Mascot, MD Katha Hamming MD ELECTRONICALLY SIGNED 06/08/2011 8:44

## 2014-07-28 NOTE — Discharge Summary (Signed)
PATIENT NAME:  Nancy [name], Nancy [name] MR#:  [id number] DATE OF BIRTH:  04-May-1945  DATE OF ADMISSION:  10/16/2011 DATE OF DISCHARGE:  10/17/2011  ADMISSION DIAGNOSIS: Hepatic encephalopathy.   DISCHARGE DIAGNOSES: 1. Hepatic encephalopathy.  2. Urinary tract infection.  3. Acute renal failure due to dehydration, resolved.  4. Hypothyroidism.  5. History of chronic obstructive pulmonary disease. 6. Depression.  7. Thrombocytopenia from chronic liver disease.  8. Hypertension.   CONSULT: Dr. Niel HummerIftikhar.   LABORATORY, DIAGNOSTIC AND RADIOLOGICAL DATA: Laboratories at discharge white blood cells 2.9, hemoglobin 9.2, hematocrit 29.9, platelets 71, sodium 138, potassium 4.6, chloride 108, bicarbonate 20, BUN 22, creatinine 1.22, glucose 273, ammonia 38.   HOSPITAL COURSE: 69 year old female with liver disease secondary to medication who presented with hepatic encephalopathy. For further details, please refer to the history and physical.  1. Hepatic encephalopathy. Patient does not want to take her lactulose due to diarrhea therefore she really does not take her lactulose. She presented again with hepatic encephalopathy after just being discharged from the hospital. Dr. Niel HummerIftikhar was consulted. He recommended neomycin and once a day lactulose. She cannot afford rifaximin. She also takes Flagyl which will be discontinued and she will take neomycin and lactulose. Her mental status is clear. Her NH3 level is normal.   2. Acute renal failure due to dehydration, which has resolved. Patient will resume her Lasix, Aldactone. Will hold her ACE inhibitor. 3. Urinary tract infection. Patient was treated for urinary tract infection during her last hospitalization. She did not actually pick up the prescription, however, her antibiotics were changed to Rocephin. She will be discharged with Keflex. No urine culture was performed.  4. Hypothyroidism. On Synthroid.  5. History of chronic obstructive pulmonary disease.   6. Depression, on Celexa.  7. Thrombocytopenia from chronic liver disease, which is stable.  8. Hypertension, which was also stable.   DISCHARGE MEDICATIONS:  1. Diltiazem 120 mg daily.  2. Glimepiride 4 mg daily.  3. Citalopram 20 mg daily.  4. Bisoprolol/HCTZ 10/6.25 daily.  5. Symbicort 2 puffs b.i.d.  6. Spiriva 18 mcg daily.  7. Flexeril 10 mg b.i.d.  8. Gabapentin 300 mg b.i.d.  9. K-Tab 10 mEq daily.  10. Oxycodone 10 mg b.i.d. p.r.n.  11. Spironolactone 25 mg daily.  12. ProAir HFA 2 puffs as needed for shortness of breath.  13. NovoLog sliding scale insulin.  14. Lantus 20 units at bedtime.  15. Lasix 20 mg daily.  16. Synthroid 100 mcg daily.  17. Zofran 4 mg q.8 hours.  18. Ambien 5 mg at bedtime p.r.n.  19. Keflex 500 mg t.i.d. for seven days.  20. Neomycin 500 mg 2 tablets q.8 hours.  21. Lactulose 30 mL daily.  22. Patient will hold benazepril until seen by her PCP.   DISCHARGE DIET: Low sodium.   DISCHARGE ACTIVITY: As tolerated.   DISCHARGE REFERRAL: Home health care and physical therapy.   DISCHARGE FOLLOW UP: Patient will follow up with her primary care physician, Dr. Dennison MascotLemont [name], in approximately one week.   TIME SPENT: 35 minutes.  ____________________________ Nancy ContesSital P. Juliene PinaMody, MD spm:cms D: 10/17/2011 13:12:24 ET T: 10/17/2011 13:30:00 ET JOB#: [id number]  cc: Kieth Hartis P. Juliene PinaMody, MD, <Dictator> Nancy MascotLemont Morrisey, MD  Nancy ContesSITAL P Mallerie Blok MD ELECTRONICALLY SIGNED 10/17/2011 16:15

## 2014-07-28 NOTE — Discharge Summary (Signed)
PATIENT NAME:  Nancy [name], Nancy [name] MR#:  [id number] DATE OF BIRTH:  [birth year]  DATE OF ADMISSION:  07/19/2011 DATE OF DISCHARGE:  07/21/2011  For a detailed note, please see the History and Physical done on admission by Dr. Enedina Finner.   DIAGNOSES AT DISCHARGE:  1. Chronic obstructive pulmonary disease exacerbation. 2. Acute respiratory failure secondary to chronic obstructive pulmonary disease exacerbation.  3. Chronic pain.  4. Hypertension.  5. Hypothyroidism.  6. Anxiety.  7. Diabetes.   DIET: The patient is being discharged on a low sodium, American Diabetic Association, low-fat diet.   ACTIVITY: As tolerated.   FOLLOWUP: Follow up with Dr. Dennison Mascot in the next 1 to 2 weeks.    DISCHARGE MEDICATIONS: 1. Lantus 20 units at bedtime.  2. Lasix 20 mg daily as needed.  3. Potassium mEq daily along with Lasix. 4. Cardizem CD 120 mg daily.  5. Aspirin 81 mg daily.  6. Magnesium oxide 400 mg b.i.d.  7. Tessalon Perles as needed.  8. Spironolactone 25 mg daily.  9. Celexa 10 mg daily.  10. Metformin 1000 mg b.i.d.  11. Glimepiride 4 mg daily. 12. Benazepril 20 mg daily.  13. Bisoprolol hydrochlorothiazide 10/6.25, 1 tab daily.  14. Nexium 40 mg daily. 15. Flexeril 5 mg b.i.d.  16. Gabapentin 100 mg two caps daily and two caps at bedtime.  17. Synthroid 100 mcg daily.  18. Oxycodone 5 mg q. 6 hours as needed. 19. Symbicort 2 puffs b.i.d.  20. Ambien 10 mg at bedtime as needed. 21. Spiriva 1 puff daily.  22. Lactulose 30 mL b.i.d. as needed.  23. Prednisone taper starting at 60 mg, down to 10 mg over the next six days.  24. Levaquin 750 mg p.o. q. 48 hours times four doses. 25. Mucinex 600 mg b.i.d. times 7 days.   PERTINENT STUDIES DURING HOSPITAL COURSE: Chest x-ray done on admission showing no evidence of acute cardiopulmonary disease.   HOSPITAL COURSE: This is a 69 year old female with multiple medical problems as mentioned above presented to the hospital 07/19/2011  secondary to shortness of breath, cough, and chronic obstructive pulmonary disease exacerbation having failed outpatient therapy with doxycycline and prednisone.  1. Chronic obstructive pulmonary disease exacerbation: The patient had failed outpatient therapy with p.o. prednisone taper and doxycycline. He presented with severe bronchospasm and shortness of breath and severe hypoxia. The patient was started on aggressive escalated treatment for chronic obstructive pulmonary disease including IV steroids, every four hour around-the-clock nebulizer treatments, and empiric antibiotics changed to Levaquin, along with maintenance of her Symbicort and Spiriva, and also started on antitussives. Over the past 48 to 72 hours the patient's clinical symptoms have significantly improved. She was ambulated on room air and did desaturate to 84% and therefore did need home oxygen. She therefore presently is being discharged on a prednisone taper with Levaquin and maintenance inhalers and home oxygen.  2. Acute respiratory failure: This was secondary to chronic obstructive pulmonary disease exacerbation. As mentioned earlier, the patient was severely bronchospastic when she presented, and has significantly improved with escalation therapy with IV steroids, around-the-clock nebulizer treatments, and empiric antibiotics. The patient currently is being discharged on a prednisone taper, Levaquin, and maintenance of her inhalers along with home oxygen.  3. Diabetes: The patient has had significantly elevated blood sugars probably secondary to the fact that she is getting steroids. She will continue her Lantus, glipizide, metformin, and glimepiride and follow up with her primary care physician.  4. Acute renal failure: This  was secondary to dehydration and use of diuretics. It has improved with the IV fluid hydration and her creatinine is now back down to baseline.  5. Hypertension: The patient was maintained on her maintenance  antihypertensives including Cardizem, Aldactone, benazepril, and bisoprolol/ HCTZ and she will continue all those upon discharge.  6. Chronic pain: The patient was maintained on her oxycodone and she will continue that.  7. Hypothyroidism: The patient was maintained on her Synthroid she will continue that upon discharge.  8. Diabetic neuropathy: The patient was maintained on gabapentin. She is being discharged on gabapentin. 9. Gastroesophageal reflux disease: The patient was maintained on Nexium she will continue that upon discharge.  10. Depression: The patient was maintained on Celexa and she will continue that upon discharge.   CODE STATUS: The patient is a FULL CODE.  TIME SPENT ON DISCHARGE: 40 minutes.   ____________________________ Rolly PancakeVivek J. Cherlynn KaiserSainani, MD vjs:bjt D: 07/21/2011 15:52:00 ET T: 07/22/2011 12:17:21 ET JOB#: [id number]  cc: Rolly PancakeVivek J. Cherlynn KaiserSainani, MD, <Dictator> Dennison MascotLemont Morrisey, MD [place] SirenVIVEK J Kamariyah Timberlake MD ELECTRONICALLY SIGNED 07/27/2011 12:19

## 2014-07-28 NOTE — H&P (Signed)
PATIENT NAME:  Nancy [name], Nancy [name] MR#:  [id number] DATE OF BIRTH:  1945/07/07  DATE OF ADMISSION:  10/13/2011  PRIMARY CARE PHYSICIAN: Dennison Mascot, MD     CHIEF COMPLAINT: Altered mental status and weakness.   HISTORY OF PRESENT ILLNESS: The patient is a 70 year old female who presents to the Emergency Room brought in by her family due to confusion, lethargy, and weakness. As per the family who gives most of the history, the patient has not been acting right for the past day or so. She felt increasingly weak today, was dosing off after eating lunch. Apparently she had had similar symptoms like this before when she was noted to have an elevated ammonia and noted to be encephalopathic. The patient does suffer from having chronic loose stools,  and therefore has not been taking her lactulose like she is supposed to,  and therefore presents to the Emergency Room. In the Emergency Room, the patient was noted to have an ammonia of 184 and appears still quite lethargic. Hospitalist Services were contacted for further treatment and evaluation. The patient denies any chest pain. She does admit to some shortness of breath, which is her baseline. She denies any abdominal pain, does admit to some nausea but no vomiting, no diarrhea, no melena, no hematochezia, and no other associated symptoms presently.   REVIEW OF SYSTEMS: CONSTITUTIONAL: No documented fever. No weight gain, no weight loss. EYES: No blurry or double vision. ENT: No tinnitus. No postnasal drip. No redness of the oropharynx. RESPIRATORY: No cough, no wheeze, no hemoptysis. Positive dyspnea, which is chronic. CARDIOVASCULAR: No chest pain, no orthopnea, no palpitations, no syncope. GASTROINTESTINAL: Positive nausea, no vomiting, no abdominal pain, no melena or hematochezia. GU: No dysuria or hematuria. ENDOCRINE: No polyuria or nocturia. No heat or cold intolerance. HEMATOLOGIC: No anemia, no bruising, no bleeding. INTEGUMENTARY: No rashes. No lesions.  MUSCULOSKELETAL: No arthritis, no swelling, and no gout. NEUROLOGIC: No numbness, no tingling, no ataxia, no seizure-type activity. PSYCHIATRIC: No anxiety, no insomnia, no ADD.   PAST MEDICAL HISTORY:  1. History of chronic liver disease.  2. Hypertension.  3. Diabetes.   4. Hyperlipidemia.  5. Hypothyroidism.  6. Gastroesophageal reflux disease.  7. Hepatic encephalopathy.  8. Chronic obstructive pulmonary.   ALLERGIES: Aspirin and Tylenol.   SOCIAL HISTORY: He used to be a smoker, quit in 2002. No alcohol abuse. No illicit drug abuse. Lives independently by herself.   FAMILY HISTORY: Consistent with hypertension, Parkinson's disease, and coronary disease in his family.   CURRENT MEDICATIONS:  1. Benazepril 20 mg daily. 2. Bisoprolol HCTZ 10/6.25 daily. 3. Celexa 20 mg daily.  4. Flexeril 10 mg b.i.d.  5. Cardizem CD 100 mg daily.  6. Gabapentin 300 mg b.i.d.  with 600 mg at bedtime.  7. Glimepiride 4 mg daily.  8. Potassium 10 mEq daily.  9. Lantus 20 units at bedtime.  10. Lasix 20 mg daily as needed. 11. Synthroid 25 mcg daily.  12. Flagyl 250 mg t.i.d.  13. Nexium 40 mg daily.  14. NovoLog sliding scale.  15. Oxycodone 10 mg b.i.d. as needed.  16. Albuterol inhaler 2 puffs every 4 to 6 hours as needed.  17. Spiriva 1 puff daily.  18. Aldactone 25 mg daily.  19. Symbicort 80/4.5, 2 puffs b.i.d.  20. Ambien 10 mg at bedtime as needed for sleep.   PHYSICAL EXAMINATION ON ADMISSION:  VITAL SIGNS: Temperature 98.7, pulse 55, respirations 18, blood pressure 117/56, sats 92% on 2 liters nasal cannula.  GENERAL: She is a pleasant-appearing female,  slightly lethargic, in no apparent distress.   HEENT: She is atraumatic, normocephalic. Her extraocular muscles are intact. The pupils are equal and reactive to light. Sclerae are anicteric. No conjunctival injection. No pharyngeal erythema.   NECK: Supple. No jugular venous distention. No bruits, no lymphadenopathy or  thyromegaly.   HEART: Regular rate and rhythm. No murmurs, rubs, or clicks.   LUNGS: She has prolonged inspiratory and expiratory phase. No wheezing, no rhonchi. Negative use of accessory muscles. No dullness to percussion.   ABDOMEN: Soft, flat, slightly distended, good bowel sounds. No hepatosplenomegaly appreciated.   EXTREMITIES: No evidence of any cyanosis, clubbing. She has trace pedal edema bilaterally, +2 pedal and radial pulses bilaterally.   NEUROLOGIC: She is alert, awake, and oriented x3. She does have a flapping tremor consistent with asterixis. Otherwise, no other focal motor or sensory deficits appreciated bilaterally.   SKIN: Moist and warm with no rashes appreciated.   LYMPHATIC: There is no cervical or axillary lymphadenopathy.   LABORATORY, DIAGNOSTIC AND RADIOLOGICAL DATA:   Serum glucose 367, BUN 23, creatinine 1.4, sodium 133, potassium 5.7, chloride 103, bicarbonate 21.  The patient's liver function tests are within normal limits.  CK 50, troponin less than 0.02.  White cell count 4.6, hemoglobin 10, hematocrit 31, platelet count 97.  Prothrombin time 15.9, INR 1.2.   ASSESSMENT AND PLAN: This is a 69 year old female with history of chronic obstructive pulmonary disease, chronic liver disease, hypertension, depression/anxiety, diabetes, diabetic neuropathy, who presents to the hospital due to altered mental status and confusion and noted to be encephalopathic.   1. Altered mental status: I suspect this is likely secondary to hepatic encephalopathy given the elevated ammonia and her clinical symptoms. I will start her on some lactulose and also Xifaxan and follow her clinically.  2. Hepatic encephalopathy: This is likely secondary to her chronic liver disease, and the patient has not been taking her lactulose as she has been having loose stools despite taking it. I will start her on some Xifaxan and lactulose as mentioned, follow her clinically, follow her mental  status, and follow her ammonia. There is no evidence of any spontaneous bacterial peritonitis, exacerbating encephalopathy, or any evidence of GI bleed.  3. Acute renal failure: Likely secondary to poor p.o. intake and dehydration. I will gently hydrate her with IV fluids, follow her BUN and creatinine, renal dose her medications, avoid nephrotoxins. I will hold her Lasix and Aldactone and ACE inhibitor for now.  4. Hyperkalemia: Likely related to renal failure and also use of Aldactone. As mentioned, I will gently hydrate her with IV fluids. Hold her Aldactone for now. Hold her ACE inhibitor for now. I will follow her potassium in the morning.  5. Diabetes: Sugars are slightly uncontrolled.  I will continue her Lantus glimepiride and sliding scale insulin, placed her on a carb-controlled diet, and follow blood sugars.  6. Gastroesophageal reflux disease: Continue Nexium.  7. Chronic obstructive pulmonary disease: No evidence of acute exacerbation. Continue Symbicort and Spiriva and p.r.n. nebulizer treatments.  8. Hypothyroidism: Continue Synthroid.  9. Hypertension: Presently hemodynamically stable. Continue Cardizem, hold bisoprolol, HCTZ and benazepril now given her renal failure, and hold her Lasix and Aldactone for now given her renal failure, too.  10. Diabetic neuropathy: Continue Neurontin.  11. Depression: Continue with Celexa.   CODE STATUS: The patient is a FULL CODE.   TIME SPENT WITH ADMISSION: 50 minutes.   ____________________________ Rolly Pancake. Cherlynn Kaiser, MD vjs:cbb D:  10/13/2011 16:48:46 ET T: 10/13/2011 17:00:13 ET JOB#: [id number]  cc: Rolly PancakeVivek J. Cherlynn KaiserSainani, MD, <Dictator> Dennison MascotLemont Morrisey, MD [place] SirenVIVEK J Raine Blodgett MD ELECTRONICALLY SIGNED 10/14/2011 14:16

## 2014-07-28 NOTE — Discharge Summary (Signed)
PATIENT NAME:  Nancy [name], Nancy [name] MR#:  [id number] DATE OF BIRTH:  [birth year]  DATE OF ADMISSION:  07/05/2011 DATE OF DISCHARGE:  07/07/2011  DISCHARGE DIAGNOSES:  1. Altered mental status, maybe secondary to dehydration with a component of hepatic encephalopathy, now at baseline.  2. Paroxysmal atrial fibrillation with rapid ventricular response, now in sinus rhythm.  3. Hepatic encephalopathy with cirrhosis, clinically improved.  4. Diabetes.  5. Hypertension.  6. Campylobacter gastroenteritis.  7. Chronic obstructive pulmonary disease.  8. Chronic back pain.  9. Pancytopenia, secondary to cirrhosis.  10. Neuropathy.   CONSULTS: Cardiology, Dr. Juliann Pares.   HOSPITAL COURSE:  69 year old female with multiple medical problems of diabetes, hypertension, neuropathy, chronic obstructive pulmonary disease, hypothyroidism, recurrent urinary tract infections, gait instability, and cirrhosis with fatty liver. She presented with abdominal pain, altered mental status, diarrhea, and vomiting. She was recently admitted to the hospital from 02/28 to 06/08/2011 with acute on chronic renal failure and Escherichia coli urinary tract infection at that time. She was also found to be in rapid atrial fibrillation when she presented.  Altered mental status was most likely in the setting of dehydration and maybe a component of hepatic encephalopathy. Urinalysis was not impressive for urinary tract infection when she came in, nitrite negative, trace leukocyte esterase, and only 6 WBCs per high-power field. Her creatinine when she came in was normal at 0.9. Her BUN was 16.  AST was 96, ALT 34. Troponins were negative. Ammonia slightly elevated at 57. She had a CT of the head done at admission which only showed mild involutional changes. Her chest x-ray was essentially negative. An MRI was done because she had atrial fibrillation to rule out a stroke. MRI of the brain was negative for any acute abnormalities, only represents  sequela of partial empty sella. The patient was started on Cardizem drip and gradually Cardizem drip was weaned off. She is now on Cardizem CD 120 mg daily. She is now in sinus rhythm. She was initially on heparin drip but that was stopped. She is not a candidate for long-term anticoagulation because of her gait instability and falls and also because of pancytopenia. She will follow up with cardiology as outpatient. We will give her low dose aspirin.  Her diarrhea, nausea, and vomiting have resolved. She is tolerating a diet. Stool studies were sent and her stool antigen was positive for Campylobacter. She got two days of Levaquin in the hospital. We are going to give her Levaquin for five days as outpatient. She said she had a formed bowel movement this morning. Stool for WBCs was negative. Stool for C. difficile was negative. When she came in she had some pancytopenia, which is chronic for her. WBC was 3.5, hemoglobin 10.8, platelet count 82,000.  Repeat CBC was in the same range. Pancytopenia is most likely secondary to cirrhosis. Her creatinine at discharge is 1.24, BUN is 21. Her magnesium is 1.5.  I have given her two grams of magnesium sulfate at discharge.  I am going to give her some magnesium oxide at home also. Her ammonia is still in the range of 57 to 65 though she is alert and oriented. I advised her to take lactulose at home for her hepatic encephalopathy and this needs to be followed up as an outpatient. Physical therapy was also consulted. She will have home physical therapy and R.N.  DISCHARGE MEDICATIONS:  1. Lantus 20 units subcutaneous daily.  2. Citalopram 10 mg daily.  3. Metformin 1000 mg b.i.d. 4. Glimepiride  4 mg daily. 5. Benazepril 20 mg daily. 6. Bisoprolol/hydrochlorothiazide 10/6.25 mg daily. 7. Nexium 40 mg daily.  8. Cyclobenzaprine 5 mg b.i.d.  9. Gabapentin 100 mg in the morning, two at noon, and two at bedtime. 10. Levothyroxine 100 mcg daily.  11. Oxycodone 5 mg q. 6  hours p.r.n.Marland Kitchen. 12. Symbicort 160/4.5, 2 puffs b.i.d. 13. Lopid 10 mg at bedtime as needed. 14. Spiriva HandiHaler daily. 15. Lasix 20 mg once daily as needed for leg swelling. 16. Potassium 10 mEq daily along with Lasix. 17. Levaquin 500 mg p.o. daily for five days.  18. Cardizem CD 120 mg p.o. once daily.  19. Lactulose 30 mL p.o. b.i.d.  20. Ecotrin 81 mg p.o. once daily. 21. Magnesium oxide 400 mg p.o. b.i.d. for two days.  DIET: Low sodium, ADA diet.   CONDITION AT DISCHARGE: Comfortable. T-max 98, heart rate 64, blood pressure 142/86, saturating 100% on room air. Chest is almost clear. Heart sounds are regular. Abdomen is soft, nontender. Well hydrated.  She is awake, alert, and oriented at this time.  FOLLOWUP: 1. The patient should have followup with Dr. Thana AtesMorrisey or Dr. Lazarus SalinesLott in one week. Follow up CBC, BMP, and ammonia level at Dr. Clance BollMorrisey's office. 2. Follow up with Dr. Niel HummerIftikhar of GI in two weeks for followup of cirrhosis and hepatic encephalopathy. 3. Follow up with Dr. Juliann Paresallwood of cardiology in two weeks. 4. Home Health physical therapy and R.N. have been arranged for the patient.    TIME SPENT ON DISCHARGE:  50 minutes.  ____________________________ Fredia SorrowAbhinav Kristel Durkee, MD ag:bjt D: 07/07/2011 11:16:44 ET T: 07/07/2011 14:40:52 ET JOB#: [id number]  cc: Fredia SorrowAbhinav Cythnia Osmun, MD, <Dictator> Dennison MascotLemont Morrisey, MD Lurline DelShaukat Iftikhar, MD Dwayne D. Juliann Paresallwood, MD Thomos Lemonsamika J. Lazarus SalinesLott, MD Fredia SorrowABHINAV Athenia Rys MD ELECTRONICALLY SIGNED 07/15/2011 17:44

## 2014-07-28 NOTE — H&P (Signed)
PATIENT NAME:  Nancy [name], Lima E MR#:  [id number] DATE OF BIRTH:  1946-03-17  DATE OF ADMISSION:  09/18/2011  PRIMARY CARE PHYSICIAN: Dennison MascotLemont Morrisey, MD    ER REFERRING PHYSICIAN: Dorothea GlassmanPaul Malinda, MD    ADMITTING PHYSICIAN: Gaynelle CageMarcel Reather Steller, MD   PRESENTING COMPLAINT: Confusion.   HISTORY OF PRESENT ILLNESS: The patient is a 69 year old lady with known cirrhosis, splenomegaly, hypertension, who was brought to the Emergency Room by concerned family members with increased confusion and lethargy this evening.  Here, work-up included an ammonia level which was elevated up to 84, and she was referred to hospitalist for further admission. The patient stated she has stopped taking lactulose over the last eight weeks. She denies any chest pain. No PND, orthopnea, pedal edema. No history of falls, loss of consciousness, no seizures, but admits to generalized weakness and has been unable to walk, has been increasingly confused and has impaired speech due to this.   REVIEW OF SYSTEMS: CONSTITUTIONAL: Positive for fever, weakness. Denies any weight loss. Admits to some weight gain but no fever. EYES: No blurred vision, redness, or discharge. ENT: No tinnitus, epistaxis, difficulty swallowing or redness of the oropharynx. RESPIRATORY: Admits to occasional cough which is nonproductive. Denies any wheezing but admits to exertional dyspnea. CARDIOVASCULAR: Denies chest pain, orthopnea. Admits to pedal edema but no arrhythmia or palpitations, syncope. GASTROINTESTINAL: Admits to some nausea but no vomiting, diarrhea. No abdominal pain. Had some abdominal distention. No change in bowel habits. GU: No dysuria, frequency, or incontinence. ENDOCRINE: No polyuria, polydipsia, heat or cold intolerance, or excessive thirst. HEMATOLOGIC: No anemia, easy bruising or bleeding or swollen glands. SKIN: No rashes or change in hair or skin texture. MUSCULOSKELETAL: No joint pains, redness, or limited activity. NEUROLOGIC: Denies any  numbness. Admits to some tremors in her limbs. No seizure or memory loss. PSYCHIATRIC: No anxiety or depression.     PAST MEDICAL HISTORY:  1. History of hyperlipidemia.  2. Hypertension.  3. Peptic ulcer disease with gastric ulcers. 4. 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  5. Splenomegaly.  6. Type 2 diabetes.  7. History of anxiety.  8. Chronic obstructive pulmonary disease. 9. Chronic pain. 10. Hypothyroidism. 11. Paroxysmal atrial fibrillation. 12. Chronic thrombocytopenia. 13. Peripheral neuropathy.   PAST SURGICAL HISTORY:  1. Breast biopsy. 2. Hysterectomy. 3. Appendectomy.  4. Cholecystectomy.  5. Lithotripsy.  6. Bilateral cataracts.   SOCIAL HISTORY: She lives at home alone. No alcohol, tobacco, or recreational drug use. She quit smoking in 2002. She is a retired Catering managerbookkeeper by profession.   FAMILY HISTORY: Positive for hypertension, coronary artery disease, CVA, and Parkinson's disease.   ALLERGIES:  The patient is allergic to aspirin and Tylenol, which she is not supposed to take due to her liver disease.    MEDICATIONS:  1. Ambien 10 mg at bedtime p.r.n.  2. Symbicort inhaler 2 puffs twice daily.  3. Spironolactone 25 mg daily.  4. Spiriva 18 mcg daily.  5. She is on prednisone taper 10 mg daily.  6. Potassium chloride 10 mEq daily with Lasix. 7. Oxycodone 5 mg every 6 hours p.r.n. for pain. 8. Nexium 40 mg daily.  9. Mucinex 600 mg every 12 hours. 10. Metformin 1000 mg twice daily.  11. Magnesium oxide 400 mg twice daily.  12. Synthroid 0.1 mg daily.  13. Lantus 20 units subcutaneous.  14. Glimepiride 4 mg daily.  15. Gabapentin 100 mg, 1 capsule in the morning, 2 at noon, and 2 capsules at night.  16.  Lasix 20 mg daily p.r.n. for leg swelling. 17. Flagyl 500 mg every 8 hours.  18. Aspirin 81 mg daily.  19. Digoxin 125 mcg daily.  20. Cyclobenzaprine  5 mg twice daily.  21. Citalopram 10 mg daily.  22. Cardizem CD 120 mg daily.   23. Bisoprolol/HCTZ 10 mg/6.25 mg, 1 tablet daily. 24. Benzonatate 30 mg three times daily. 25. Benazepril 20 mg once daily.   NOTE: Please note, the patient is supposed to be on lactulose 30 mg b.i.d. p.r.n., but she has been off this medication for the last 8 weeks.   PHYSICAL EXAMINATION:  VITAL SIGNS: Temperature 97.5, pulse 70, respiratory rate 18, blood pressure 118/56, saturations are 95% on room air.   GENERAL: Elderly lady lying on the gurney, awake, alert, and oriented to time, place, and person, in no obvious distress having difficulty remembering things.   HEENT: Atraumatic, normocephalic. Pupils are equal and reactive to light and accommodation. Extraocular movement intact. Mucous membranes pink, moist.   NECK: Supple. No JV distention.   CHEST: Decreased air entry in the bases and a few transmitted breath sounds, no rhonchi, no rales.   HEART: Regular rate and rhythm. No murmurs.  ABDOMEN: Distended, positive fluid thrill, moves with respiration, nontender. Bowel sounds normoactive. Splenomegaly palpable.   EXTREMITIES: Trace edema of bilateral lower extremities, more on the left than the right.   NEUROLOGICAL:  Cranial nerves II through XII are grossly intact. No focal motor or sensory deficit. Positive asterixis noted.   PSYCHIATRIC: Affect flat.   LABORATORY, DIAGNOSTIC AND RADIOLOGICAL DATA:  EKG showed normal sinus rhythm 66. No acute ST-T wave changes.  CBC: White count 4, hemoglobin 10, platelets 102.  Chemistry shows sodium 138, potassium 6.4, from 4.2 from two months ago. Creatinine 1.7 from 1.1 from two months ago. BUN is 39, glucose 176, calcium 9.2, ammonia level is 84 from 65 from two months ago. AST 41 from 37.  Troponin is negative. TSH is 1.0.  Urinalysis is negative.  Chest x-ray and CT head done here showed no acute intracranial or cardiopulmonary abnormalities.   IMPRESSION:  1. Hepatic encephalopathy secondary to noncompliance.  2. Cirrhosis  noted.  3. Type 2 diabetes, currently poorly controlled.  4. Hypertension, stable.  5. Acute kidney injury, multifactorial from dehydration coupled with diuretic use and ACE inhibitor use.  6. Hyperkalemia most likely secondary to acute kidney injury along with potassium supplements use.  7. Thrombocytopenia, likely from the cirrhosis.  8. Hypothyroidism, stable.  9. Chronic obstructive pulmonary disease, stable.   PLAN:  1. Admit to general medical floor under telemetry for supportive care, for lactulose 30 mL p.o. b.i.d. IV fluids for rehydration. Kayexalate 15 grams p.o. stat and repeat in four hours. Telemonitoring, seizure, fall, and aspiration precautions. Sliding-scale insulin to augment her blood sugar control. Sodium bicarbonate, calcium gluconate and insulin infusion along with D50 for resolve of her hyperkalemia. Trend renal function tests. Resume outpatient medications. We will hold off on the ACE inhibitor and diuretic, and potassium supplements.  2. GI prophylaxis with Protonix.  3. Deep vein thrombosis prophylaxis with subcutaneous heparin.  4. Anticipate discharge in the next 48 hours.   CODE STATUS:  FULL CODE.     TOTAL PATIENT CARE TIME: 50 minutes.   ____________________________ Floy Sabina Tilda Franco, MD mia:cbb D: 09/18/2011 06:54:41 ET T: 09/18/2011 09:06:58 ET JOB#: [id number]  cc: Weslee Fogg I. Tilda Franco, MD, <Dictator> Dennison Mascot, MD Margaret Pyle MD ELECTRONICALLY SIGNED 09/23/2011 2:09

## 2014-07-28 NOTE — H&P (Signed)
PATIENT NAME:  Nancy [name], Nancy [name] MR#:  [id number] DATE OF BIRTH:  12-22-1945  DATE OF ADMISSION:  06/03/2011  ADDENDUM  Patient's blood sugars have been 171 in the ER so we are going to give her sliding scale and hold the Lantus at this time and recheck the blood glucose every six hours and probably by tomorrow she can start her p.o. intake and restart her metformin and Lantus.   ____________________________ Katha HammingSnehalatha Carlota Philley, MD sk:cms D: 06/03/2011 16:55:55 ET T: 06/03/2011 17:07:07 ET JOB#: [id number]  cc: Katha HammingSnehalatha Danyal Whitenack, MD, <Dictator>  Katha HammingSNEHALATHA Jayli Fogleman MD ELECTRONICALLY SIGNED 06/08/2011 8:43

## 2014-07-28 NOTE — H&P (Signed)
PATIENT NAME:  Nancy [name], Nancy [name] MR#:  [id number] DATE OF BIRTH:  1945-11-23  DATE OF ADMISSION:  07/05/2011  REFERRING PHYSICIAN: Dr. Dolores Frame PRIMARY CARE PHYSICIAN: Dr. Dennison Mascot  PRESENTING COMPLAINT: Altered mental status, abdominal pain, diarrhea, vomiting.   HISTORY OF PRESENT ILLNESS: Nancy [name] is a 69 year old woman with history of hypertension, diabetes, chronic kidney disease, hypothyroidism, chronic obstructive pulmonary disease, history of gait instability with falls who was recently admitted from 02/28 to 06/08/2011 for management of hyperkalemia, ATN, Escherichia coli pyelonephritis and found to have paroxysmal atrial fibrillation who represents with reports of developing abdominal pain, diarrhea, vomiting since Friday. Patient has difficulty with word finding and is having slow response with speech. Her son provides assistance with her history. Reports that currently her diarrhea and vomiting and abdominal pain have resolved but since Saturday morning she has been confused, cannot complete sentences. Her symptoms have not worsened since then and her gait instability has remained stable, is getting physical therapy. She was actually recommended for rehab on her last admission. She denies any chest pain, presyncope or syncope. Denies any worsening shortness of breath from her baseline. No dysuria or hematuria. She reports developing fever on Saturday of 101. On initial evaluation she was found to be febrile with temperature of 100 here in the ED. Initially also presented in sinus rhythm but developed atrial fibrillation, RVR during her evaluation.   PAST MEDICAL HISTORY:  1. Admitted to 02/28 to 06/08/2011 for management of hyperkalemia thought to be in the setting of supplements and ATN with acute on chronic renal failure as well as Escherichia coli pyelonephritis and paroxysmal atrial fibrillation.  2. Hypertension.  3. Diabetes.  4. Chronic kidney disease.  5. Depression/anxiety.   6. Hypothyroidism.  7. Chronic obstructive pulmonary disease.  8. Recurrent UTIs.  9. Nephrolithiasis.  10. Neuropathy.  11. Gait instability with falls.  12. Cirrhosis and fatty liver.  13. Bilateral cataracts.   PAST SURGICAL HISTORY:  1. Hysterectomy.  2. Cholecystectomy.  3. Benign breast lesion excision.  4. Status post lithotripsy.   ALLERGIES: No known drug allergies. She has been recommended to avoid over-the-counter medications with Tylenol, aspirin, vitamin supplements due to her liver disease.   SOCIAL HISTORY: Quit smoking in 2002. No alcohol or drug use. She lives in [place] in an independent facility alone.   FAMILY HISTORY: Hypertension, Parkinson's disease, coronary artery disease, stroke.   MEDICATIONS:  1. Citalopram 10 mg daily.  2. Synthroid 100 mcg daily.  3. Metformin 1000 mg b.i.d.  4. Glimepiride 4 mg daily.  5. Benazepril 20 mg daily.  6. Bisoprolol hydrochlorothiazide 10/6.25, 1 tablet daily.  7. Nexium 40 mg daily.  8. Cyclobenzaprine 5 mg b.i.d.  9. Lasix 20 mg b.i.d.  10. Gabapentin 100 mg q.a.m., 200 mg at noon and 200 mg at bedtime.  11. Oxycodone 5 mg every six hours as needed.  12. Symbicort 2 puffs b.i.d.  13. Zolpidem 10 mg at bedtime as needed.  14. Flomax 0.4 mg daily.  15. Spiriva daily.  16. Potassium chloride 10 mEq daily.  17. Lantus 20 units subcutaneous at bedtime.   REVIEW OF SYSTEMS: CONSTITUTIONAL: Endorses fevers, nausea and vomiting. EYES: No glaucoma. She has cataracts. ENT: No epistaxis or discharge. Denies any dysphagia. RESPIRATORY: No cough, wheezing, hemoptysis. Reports shortness of breath at baseline. CARDIOVASCULAR: No chest pain, orthopnea, syncope. GASTROINTESTINAL: Endorses nausea, vomiting, diarrhea, abdominal pain as per history of present illness. No hematemesis or melena. GENITOURINARY: No dysuria, hematuria. ENDO: No polyuria  or polydipsia. HEMATOLOGIC: No easy bleeding. SKIN: No ulcers. MUSCULOSKELETAL: No  joint swelling. NEUROLOGIC: No one-sided weakness or numbness. Reports slurred speech and difficulty completing sentences. PSYCH: Endorses depression but no suicidal ideation.   PHYSICAL EXAMINATION:  VITAL SIGNS: Temperature 100, pulse 88, respiratory rate 18, blood pressure 189/82, sating at 94% on room air.   GENERAL: Lying in bed in no apparent distress.   HEENT: Normocephalic, atraumatic. Pupils equal and symmetric, nonicteric. Nares without discharge. Moist mucous membrane.   NECK: Soft and supple. No adenopathy or JVP.   CARDIOVASCULAR: Tachy and irregular. No murmurs, rubs, or gallops.   LUNGS: Basilar crackles. No use of accessory muscles or increased respiratory effort.   ABDOMEN: Soft. Positive bowel sounds, nontender. No mass appreciated.   EXTREMITIES: No edema. Dorsal pedis pulses intact.   MUSCULOSKELETAL: No joint effusion.   SKIN: No ulcers.   NEUROLOGIC: She has symmetrical strength. Nose to finger intact. No facial drooping. No asterixis or pronator drift. She does have difficulty word finding but no slurring of her speech.   PSYCH: She is alert with slow response to questions.   LABORATORY, DIAGNOSTIC AND RADIOLOGICAL DATA: Ammonia level 57, INR 1.4, WBC 3.5, hemoglobin 10.8, hematocrit 32.2, platelets 82, MCV 88, glucose 185, BUN 16, creatinine 0.97, sodium 137, potassium 3.2, chloride 103, carbon dioxide 23, calcium 8.2, AST 96, ALT 34, alkaline phosphatase 119, total bilirubin 1. Troponin less than 0.02. Urinalysis with specific gravity of 1.019, blood 1+, pH 5, protein 100 mg/dL, negative nitrite, leukocyte esterase trace, RBC 1 per high-power field, WBC 6 per high-power field. EKG initially sinus rate of 85. No ST changes. Repeat EKG with atrial fibrillation, RVR, rate of 137. No ST changes. Current heart rate is in the 140s. CT scan of the head without any acute findings.   ASSESSMENT AND PLAN: Nancy [name] is a pleasant 69 year old woman with history of  hypertension, diabetes, chronic kidney disease, hypothyroidism, chronic obstructive pulmonary disease, gait instability and falls, paroxysmal atrial fibrillation, neuropathy presenting with altered mental status, nausea, vomiting, diarrhea.  1. Altered mental status concerning for acute cerebrovascular accident in the setting of paroxysmal atrial fibrillation, RVR. Urinalysis doesn't  seem impressive, last admission with Escherichia coli pyelonephritis and urine was revealing at that time but will stop antibiotics, was given ciprofloxacin in the Emergency Department. Her CT head is unrevealing. Echo from February 2013 with ejection fraction greater than 55% without significant valvular abnormalities. Will send MRI and carotid Doppler's. Will also obtain a PT evaluation. Obtain cardiology consult to assist with atrial fibrillation, RVR, and recommendations for medications in light of her gait instability and falls with anticoagulation. Will start her on aspirin, heparin drip. Patient received Cardizem IV without response in heart rate therefore will continue on Cardizem drip and admit to the Critical Care Unit. Continue oxygen. Her TSH in February 2012 was within normal limits. Will correct her hypokalemia and send a mag level.  2. Vomiting and diarrhea. Will send stool studies and Clostridium difficile. This could be contributing to her atrial fibrillation, RVR.  3. Pancytopenia, appears to be chronic and stable. Will need follow up with PCP and recommendations to hematology/oncology if needed.  4. Chronic obstructive pulmonary disease. Stable.  5. Diabetes. Send A1c. Start sliding scale insulin, Lantus, glimepiride. Will hold metformin.  6. Hypertension. Restart benazepril and Ziac.  7. Prophylaxis on heparin drip, aspirin and Nexium.         TIME SPENT: Approximately 50 minutes spent on patient care.   ____________________________  Tamura Lasky, MD ap:cms D: 07/05/2011 03:44:16  ET T: 07/05/2011 07:29:03 ET JOB#: [id number]  cc: Pearlean Brownie Heath Badon, MD, <Dictator> Dennison Mascot, MD Reuel Derby MD ELECTRONICALLY SIGNED 07/30/2011 22:48

## 2014-07-28 NOTE — H&P (Signed)
PATIENT NAME:  Nancy [name], Nancy [name] MR#:  [id number] DATE OF BIRTH:  February 02, 1946  DATE OF ADMISSION:  10/16/2011  REFERRING PHYSICIAN: Dr Marilynne Halsted. PRIMARY CARE PHYSICIAN: Dr Dennison Mascot  CHIEF COMPLAINT: Lethargy and falls.   HISTORY OF PRESENT ILLNESS: This is a 69 year old female with multiple admissions due to hepatic encephalopathy. The patient was recently discharged on October 14, 2011. She went home, was brought by family today as she was noticed to be lethargic, more confused and had a couple of falls today. The patient's ammonia level was found to be 82. The patient reports she was only taking ammonia one time a day due to her diarrhea. Patient was found to be in acute renal failure where creatinine elevated from baseline of 1.5 to 1.87. As well, patient found to have positive urinalysis. Patient was discharged home on Cipro, but it appears her urinalysis showing worsening leukocyte esterase and increased white blood cells in urine. Upon presentation patient appears to be confused and lethargic to ED physician, but upon my presentation patient was already given 1 dose of p.o. lactulose and was noted her mental status was much improved where she was awake, alert x3, aware of her surroundings, her medical condition. Patient denies any fever. Complains of generalized fatigue. Denies any chest pain, shortness of breath, any coffee-grounds emesis, hematemesis, melena, hematochezia, bright red blood per rectum, or jaundice.   PAST MEDICAL HISTORY:  1. History of chronic liver disease. Biopsy done in 2011 showing evidence of steatohepatitis and acute injury of unknown etiology; patient as well was told by GI it is due to over-the-counter pain abuse, mainly Tylenol.  2. Hypertension.  3. Diabetes.  4. Hyperlipidemia.  5. Hypothyroidism.  6. Gastroesophageal reflux disease.  7. Hepatic encephalopathy.  8. Chronic obstructive pulmonary disease.   ALLERGIES: Aspirin and Tylenol.   SOCIAL HISTORY: Patient  quit smoking in 2002. No alcohol abuse. No illicit drug abuse. Lives by herself.   FAMILY HISTORY: Family history consistent for hypertension, Parkinson disease, and coronary artery disease.   HOME MEDICATIONS:  1. Diltiazem 120 mg daily.  2. Glyburide 4 milligrams daily.  3. Citalopram 20 mg daily.  4. Bisoprolol/hydrochlorothiazide 10/6.25 mg daily.  5. Symbicort 2 puffs twice a day.  6. Spiriva 18 mcg daily.  7. Benazepril 20 mg daily.  8. Flexeril 10 mg twice a day.  9. Gabapentin 300 mg in the morning and 200 at bedtime.  10. Synthroid 25 mcg daily.  11. Lasix 20 mg daily.  12. Oxycodone 10 mg twice a day p.r.n.  13. Aldactone 25 mg daily.  14. Ambien 10 mg at bedtime.  15. Pro-Air HFA 2 puffs as needed.  16. Nexium 40 mg daily.  17. Flagyl 250 mg 3 times a day.  18. NovoLog sliding scale.  19. Lantus 20 units at bedtime.  20. Ciprofloxacin 250 one tablet every 12 hours, today is day #3.     REVIEW OF SYSTEMS: Patient denies any fever. Complains of fatigue and weakness. EYES: Denies any blurry vision, double vision or pain. ENT: Denies any tinnitus, ear pain, or hearing loss. RESPIRATORY: Denies any cough, wheezing, hemoptysis, dyspnea. CARDIOVASCULAR: Denies any chest pain, orthopnea, had bilateral lower extremity edema. Denies any palpitations. GI: Denies any nausea, vomiting, abdominal pain, melena, jaundice, rectal bleeding, constipation, complains of diarrhea. GU: Denies any dysuria, hematuria, renal colic. ENDOCRINE: Denies any polyuria, polydipsia, heat or cold intolerance. HEMATOLOGY: Denies any easy bruising, bleeding diathesis, or blood clots. INTEGUMENTARY: Denies any acne or rash. MUSCULOSKELETAL: Denies  any gout, redness, cramps. NEUROLOGICAL: Complains of generalized weakness. Denies any dysarthria, epilepsy, tremors, vertigo. PSYCHIATRIC: Denies any anxiety, insomnia, schizophrenia.  PHYSICAL EXAMINATION:   VITAL SIGNS: Temperature 98.3, pulse 60, respiratory rate 16,  blood pressure 102/54, saturating 94% on 2 liters nasal cannula.   GENERAL: Elderly female who is comfortable in bed in no apparent distress.   HEENT: Head atraumatic, normocephalic. Pupils equal, reactive to light. Pink conjunctivae. Anicteric sclerae. Moist oral mucosa.   NECK: Supple. No thyromegaly. No JVD.   CHEST: Good air entry bilaterally. No wheezing, rales, rhonchi.   CARDIOVASCULAR: S1, S2 heard. No rubs, murmur, or gallops.   ABDOMEN: Obese, soft, nontender, nondistended. Bowel sounds present.   EXTREMITIES: +1 to 2 bilateral lower extremity edema. No clubbing. No cyanosis. Good capillary refill. Good pulses.     NEUROLOGIC: Cranial nerves grossly intact. Motor 5 out of 5 in all extremities.   PSYCHIATRIC: Appropriate affect. Awake, alert x3. Intact judgment and insight. Patient aware of her surroundings, aware of her medical condition.  PERTINENT LABS: Glucose 169, BUN 29, creatinine 1.84, sodium 137, potassium 4.9, chloride 106, CO2 of 23, ammonia 82, total protein 8.4, albumin 2.7, alkaline phosphatase 139, AST 54, ALT 22. White blood cells 5.1, hemoglobin 9.5, hematocrit 30.2, platelets 91,000.  INR 1.4. Urinalysis showing 27 white blood cells and leukocyte esterase +2.   ASSESSMENT AND PLAN:  1. Encephalopathy, appears to be multifactorial, but most likely due to hepatic encephalopathy as patient had ammonia level due to noncompliance with medication, and she was only taking lactulose once a day secondary to diarrhea. Currently much improved after she was resumed on lactulose in ED. We will increase her lactulose dosing to 30 mL every 8 hours and will start her on neomycin as well.  2. As well this might be due to acute renal failure from volume depletion and secondary to urinary tract infection as well.  As well, we will hold her pain medication, mainly oxycodone; and we will stop her gabapentin and Flexeril.  3. Chronic liver disease. Etiology is unclear at this point,  possibly due to Tylenol abuse versus steatohepatitis. We will hold her Lasix and Aldactone due to her acute renal failure.  4. Acute renal failure. We will hold Lasix, Aldactone, and continue with gentle hydration.  5. Urinary tract infection. Patient has been on Cipro with no improvement. We will change her antibiotics to IV Rocephin. We will obtain urine culture and adjust antibiotics if needed.  6. Diabetes. We will hold oral hypoglycemic and long acting insulin, and we will start her on sliding scale and adjust accordingly.  7. Gastroesophageal reflux disease. Continue with PPI.  8. Chronic obstructive pulmonary disease. No wheezing. No evidence of acute exacerbation.  Continue her on Symbicort, Spiriva and p.r.n. inhalers.  9. Hypothyroidism. Continue with Synthroid.  10. Hypertension. Blood pressure is borderline low so we will hold medications.  11. Depression. Continue with citalopram.  12. Deep vein thrombosis prophylaxis. Sequential compression device.     CODE STATUS: THE PATIENT IS DO NOT RESUSCITATE. Discussed at length with the patient who is aware of her medical condition and basically reports, "I do not want to suffer any more if it happens."   TOTAL TIME SPENT ON PATIENT CARE: 50 minutes.    ____________________________ Starleen Armsawood S. Zavannah Deblois, MD dse:vtd D: 10/16/2011 02:30:57 ET T: 10/16/2011 08:36:32 ET JOB#: [id number]  cc: Starleen Armsawood S. Hannibal Skalla, MD, <Dictator> Dennison MascotLemont Morrisey, MD Daqwan Dougal Teena IraniS Alexica Schlossberg MD ELECTRONICALLY SIGNED 10/17/2011 2:26

## 2015-06-08 IMAGING — US US EXTREM LOW VENOUS*L*
1 series · 14 of 24 positions shown · non-contrast
Comparison: November 01, 2012

CLINICAL DATA: Left lower extremity pain

EXAM:
VENOUS DUPLEX ULTRASOUND OF LEFT LOWER EXTREMITY
TECHNIQUE: Gray-scale sonography with graded compression, as well as color
Doppler and duplex ultrasound, were performed to evaluate the deep
venous system from the level of the common femoral vein through the
popliteal and proximal calf veins. Spectral Doppler was utilized to
evaluate flow at rest and with distal augmentation maneuvers.

[Series 1: us extrem low venous*left* · 0.08mm/px · 14 of 38 slices shown]
[im 1/38]
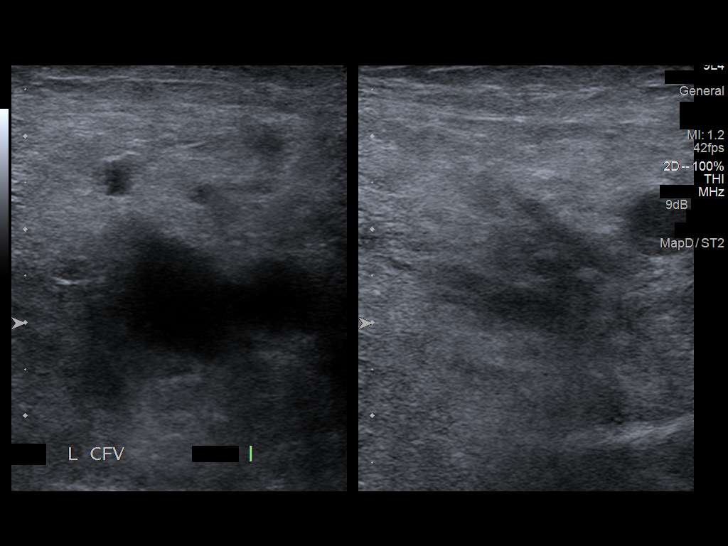
[im 4/38]
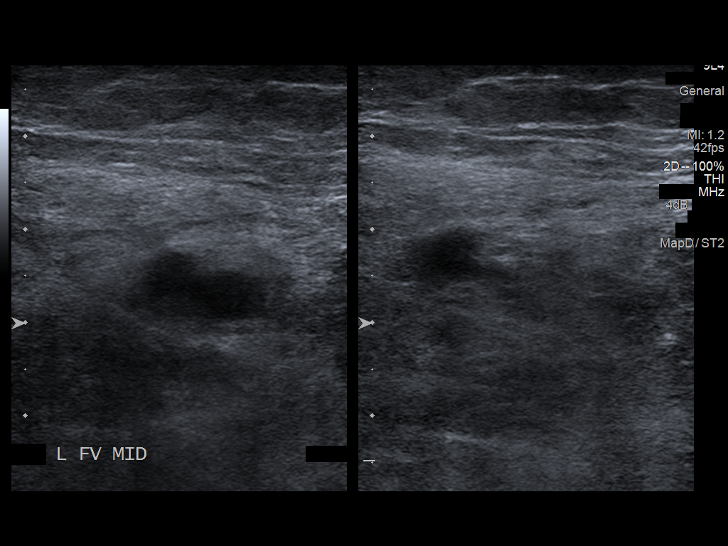
[im 7/38]
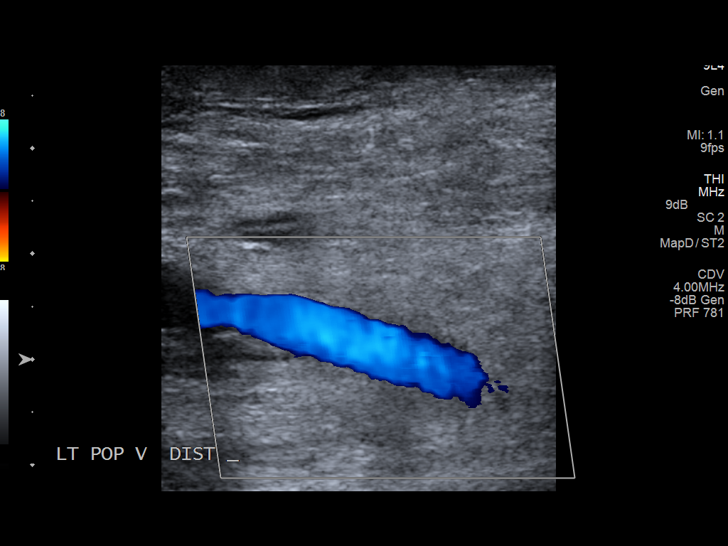
[im 10/38]
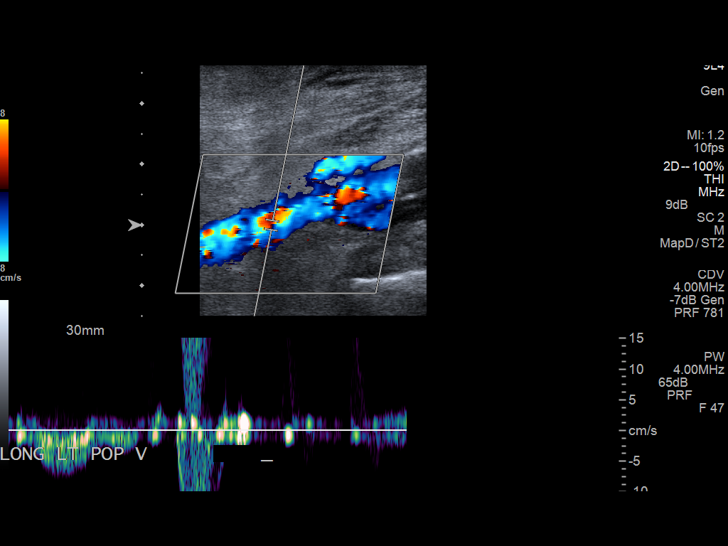
[im 12/38]
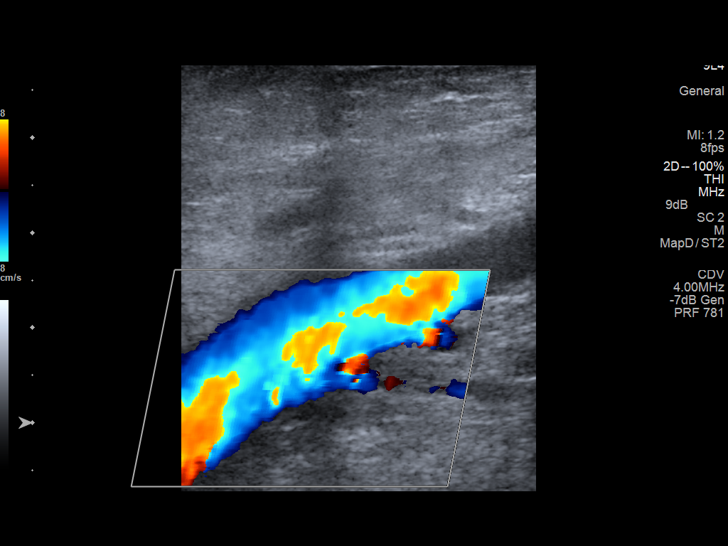
[im 15/38]
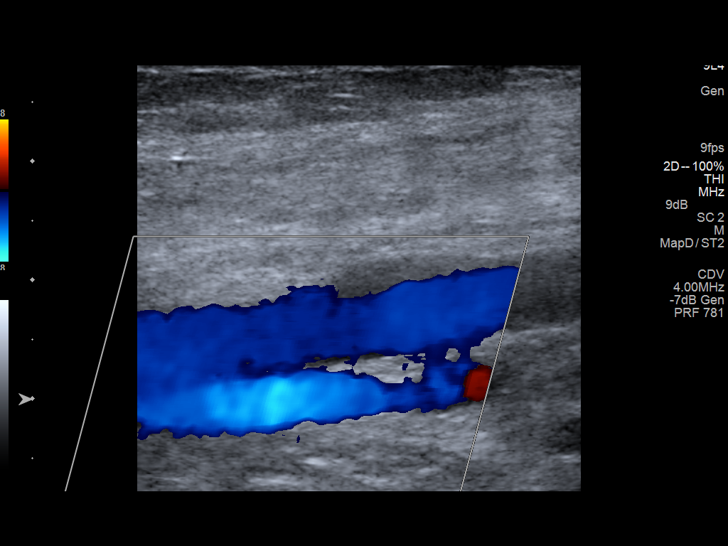
[im 18/38]
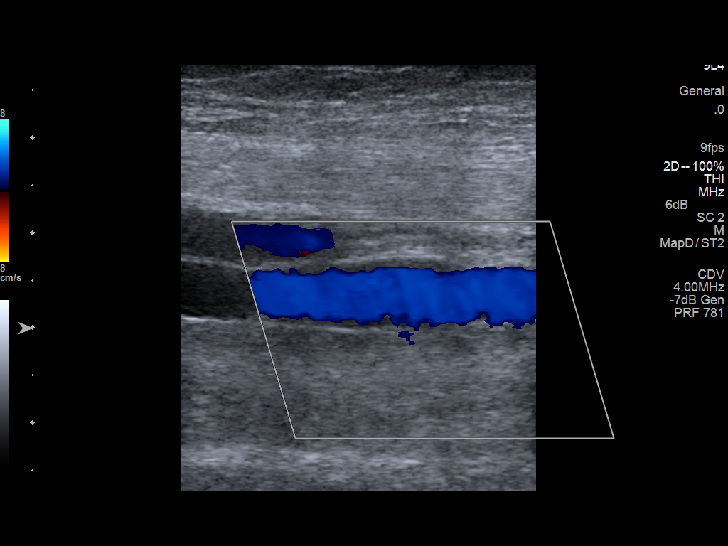
[im 20/38]
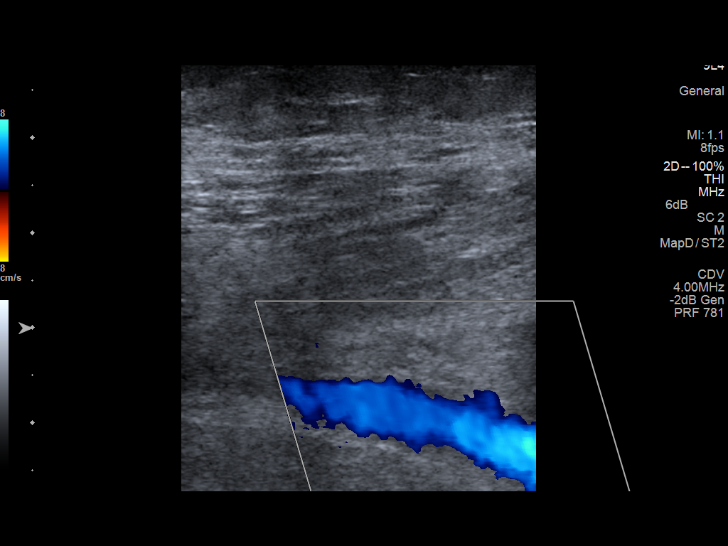
[im 23/38]
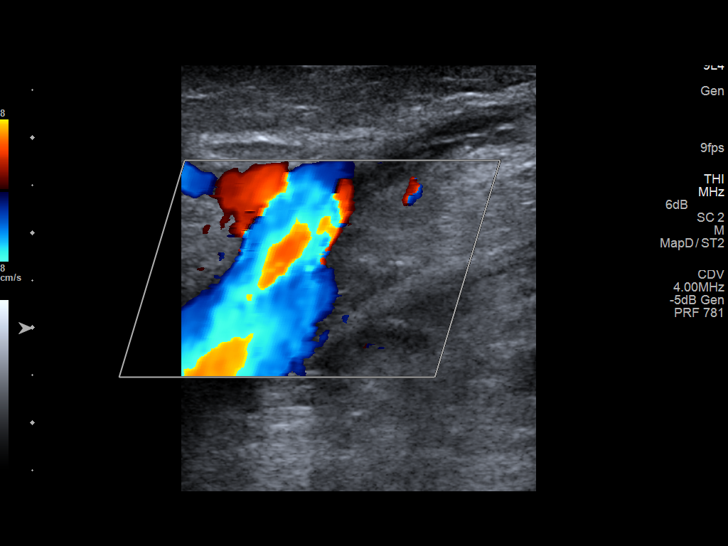
[im 26/38]
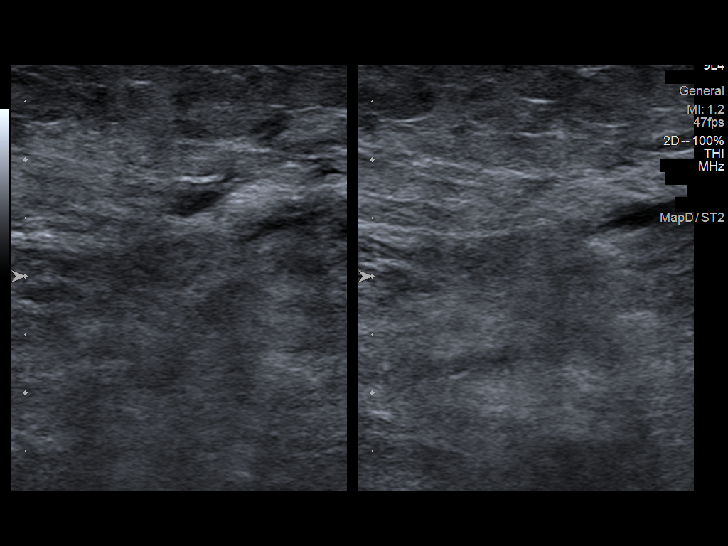
[im 29/38]
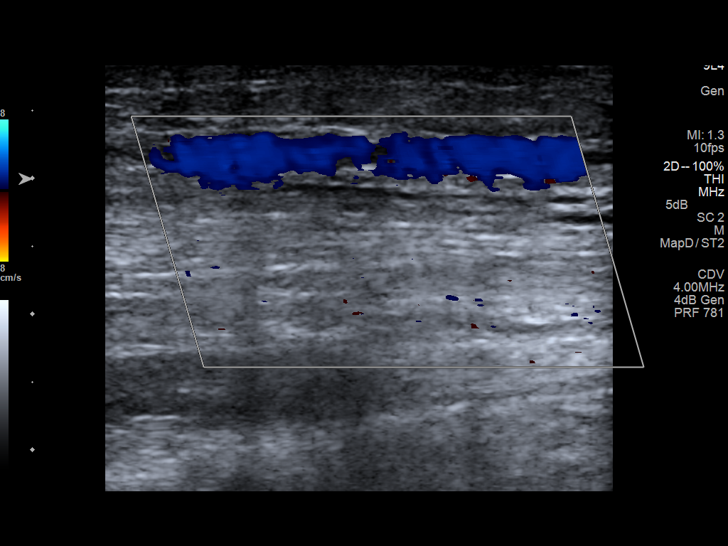
[im 31/38]
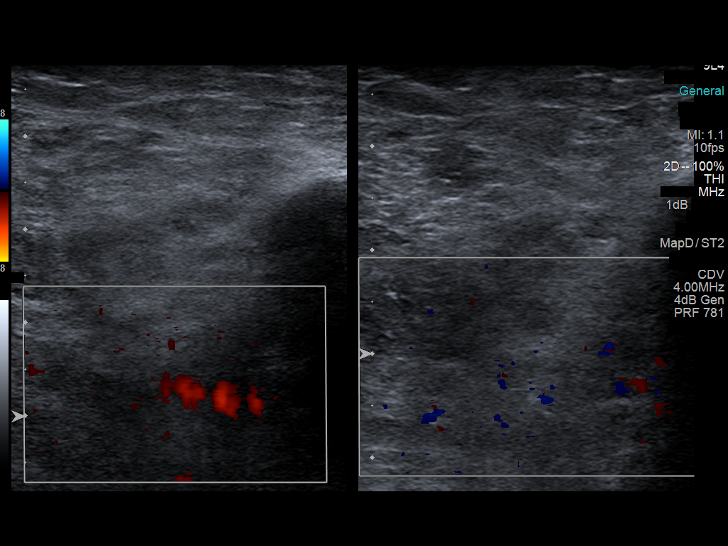
[im 34/38]
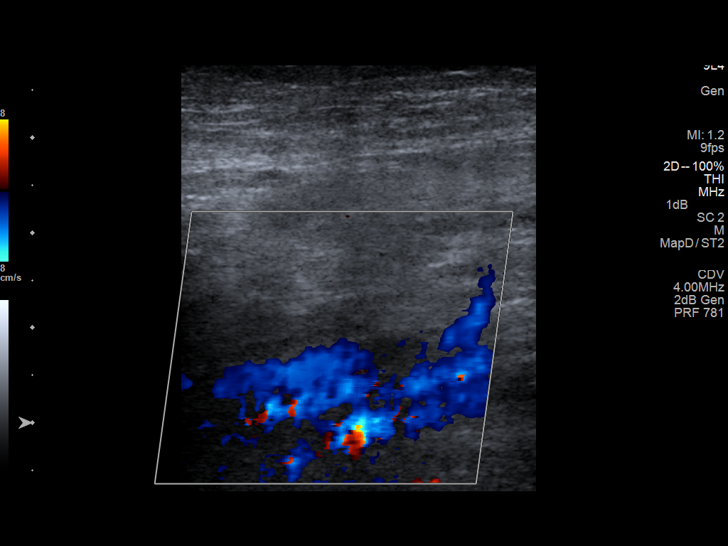
[im 38/38]
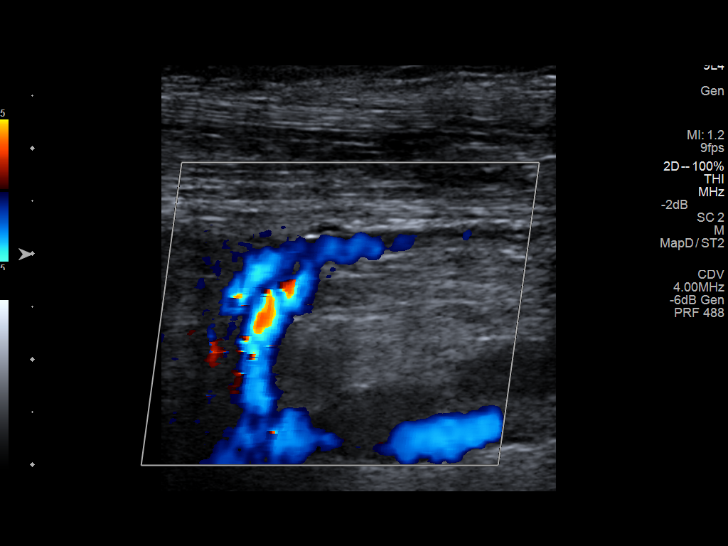

[14 of 24 positions shown; findings below may reference images not displayed]

FINDINGS: The flow in the venous structures of the left lower extremity is
spontaneous and phasic in all segments. There is normal compression
and augmentation in the venous structures of the left lower
extremity. Venous Doppler signal is normal in all regions. There is
no thrombus in the deep or visualized superficial venous structures
on the left. There is no left lower extremity deep venous
incompetence.
IMPRESSION: No evidence of left lower extremity deep venous thrombosis.
